# Patient Record
Sex: Female | Born: 1970 | Race: White | Hispanic: No | Smoking: Never smoker
Health system: Southern US, Community
[De-identification: ages and names within clinical notes are randomized; demographics above are authoritative.]

## PROBLEM LIST (undated history)

## (undated) DIAGNOSIS — J309 Allergic rhinitis, unspecified: Secondary | ICD-10-CM

## (undated) DIAGNOSIS — A0472 Enterocolitis due to Clostridium difficile, not specified as recurrent: Secondary | ICD-10-CM

## (undated) DIAGNOSIS — T7840XA Allergy, unspecified, initial encounter: Secondary | ICD-10-CM

## (undated) DIAGNOSIS — I10 Essential (primary) hypertension: Secondary | ICD-10-CM

## (undated) DIAGNOSIS — F419 Anxiety disorder, unspecified: Secondary | ICD-10-CM

## (undated) HISTORY — DX: Enterocolitis due to Clostridium difficile, not specified as recurrent: A04.72

## (undated) HISTORY — PX: TONSILLECTOMY: SUR1361

## (undated) HISTORY — DX: Allergic rhinitis, unspecified: J30.9

## (undated) HISTORY — DX: Essential (primary) hypertension: I10

## (undated) HISTORY — DX: Anxiety disorder, unspecified: F41.9

## (undated) HISTORY — DX: Allergy, unspecified, initial encounter: T78.40XA

---

## 2001-02-10 ENCOUNTER — Emergency Department (HOSPITAL_COMMUNITY): Admission: EM | Admit: 2001-02-10 | Discharge: 2001-02-10 | Payer: Self-pay | Admitting: Emergency Medicine

## 2001-02-10 ENCOUNTER — Encounter: Payer: Self-pay | Admitting: Emergency Medicine

## 2001-05-15 ENCOUNTER — Other Ambulatory Visit: Admission: RE | Admit: 2001-05-15 | Discharge: 2001-05-15 | Payer: Self-pay | Admitting: Obstetrics and Gynecology

## 2005-07-26 ENCOUNTER — Other Ambulatory Visit: Admission: RE | Admit: 2005-07-26 | Discharge: 2005-07-26 | Payer: Self-pay | Admitting: Obstetrics & Gynecology

## 2013-05-09 ENCOUNTER — Other Ambulatory Visit: Payer: Self-pay | Admitting: Obstetrics & Gynecology

## 2013-05-09 DIAGNOSIS — R928 Other abnormal and inconclusive findings on diagnostic imaging of breast: Secondary | ICD-10-CM

## 2013-05-14 ENCOUNTER — Ambulatory Visit
Admission: RE | Admit: 2013-05-14 | Discharge: 2013-05-14 | Disposition: A | Discharge status: Home / Self Care | Payer: 59 | Source: Ambulatory Visit | Attending: Obstetrics & Gynecology | Admitting: Obstetrics & Gynecology

## 2013-05-14 DIAGNOSIS — R928 Other abnormal and inconclusive findings on diagnostic imaging of breast: Secondary | ICD-10-CM

## 2014-06-05 ENCOUNTER — Other Ambulatory Visit: Payer: Self-pay

## 2014-06-05 DIAGNOSIS — Z1231 Encounter for screening mammogram for malignant neoplasm of breast: Secondary | ICD-10-CM

## 2014-06-10 ENCOUNTER — Ambulatory Visit
Admission: RE | Admit: 2014-06-10 | Discharge: 2014-06-10 | Disposition: A | Discharge status: Home / Self Care | Payer: 59 | Source: Ambulatory Visit

## 2014-06-10 DIAGNOSIS — Z1231 Encounter for screening mammogram for malignant neoplasm of breast: Secondary | ICD-10-CM

## 2014-06-12 ENCOUNTER — Other Ambulatory Visit: Payer: Self-pay | Admitting: Obstetrics & Gynecology

## 2014-06-12 DIAGNOSIS — R928 Other abnormal and inconclusive findings on diagnostic imaging of breast: Secondary | ICD-10-CM

## 2014-06-19 ENCOUNTER — Ambulatory Visit
Admission: RE | Admit: 2014-06-19 | Discharge: 2014-06-19 | Disposition: A | Discharge status: Home / Self Care | Payer: 59 | Source: Ambulatory Visit | Attending: Obstetrics & Gynecology | Admitting: Obstetrics & Gynecology

## 2014-06-19 DIAGNOSIS — R928 Other abnormal and inconclusive findings on diagnostic imaging of breast: Secondary | ICD-10-CM

## 2014-11-26 ENCOUNTER — Other Ambulatory Visit: Payer: Self-pay | Admitting: Obstetrics & Gynecology

## 2014-11-26 DIAGNOSIS — D241 Benign neoplasm of right breast: Secondary | ICD-10-CM

## 2014-12-18 ENCOUNTER — Ambulatory Visit
Admission: RE | Admit: 2014-12-18 | Discharge: 2014-12-18 | Disposition: A | Discharge status: Home / Self Care | Payer: 59 | Source: Ambulatory Visit | Attending: Obstetrics & Gynecology | Admitting: Obstetrics & Gynecology

## 2014-12-18 DIAGNOSIS — D241 Benign neoplasm of right breast: Secondary | ICD-10-CM

## 2015-05-18 ENCOUNTER — Other Ambulatory Visit: Payer: Self-pay | Admitting: Obstetrics & Gynecology

## 2015-05-18 DIAGNOSIS — N6310 Unspecified lump in the right breast, unspecified quadrant: Secondary | ICD-10-CM

## 2015-06-23 ENCOUNTER — Ambulatory Visit
Admission: RE | Admit: 2015-06-23 | Discharge: 2015-06-23 | Disposition: A | Discharge status: Home / Self Care | Payer: 59 | Source: Ambulatory Visit | Attending: Obstetrics & Gynecology | Admitting: Obstetrics & Gynecology

## 2015-06-23 DIAGNOSIS — N6310 Unspecified lump in the right breast, unspecified quadrant: Secondary | ICD-10-CM

## 2016-05-16 ENCOUNTER — Other Ambulatory Visit: Payer: Self-pay | Admitting: Obstetrics & Gynecology

## 2016-05-16 DIAGNOSIS — N6310 Unspecified lump in the right breast, unspecified quadrant: Secondary | ICD-10-CM

## 2016-06-23 ENCOUNTER — Ambulatory Visit
Admission: RE | Admit: 2016-06-23 | Discharge: 2016-06-23 | Disposition: A | Discharge status: Home / Self Care | Payer: 59 | Source: Ambulatory Visit | Attending: Obstetrics & Gynecology | Admitting: Obstetrics & Gynecology

## 2016-06-23 ENCOUNTER — Other Ambulatory Visit: Payer: Self-pay | Admitting: Obstetrics & Gynecology

## 2016-06-23 DIAGNOSIS — N6320 Unspecified lump in the left breast, unspecified quadrant: Secondary | ICD-10-CM

## 2016-06-23 DIAGNOSIS — N6310 Unspecified lump in the right breast, unspecified quadrant: Secondary | ICD-10-CM

## 2016-12-20 ENCOUNTER — Encounter: Payer: Self-pay | Admitting: Gastroenterology

## 2017-01-24 DIAGNOSIS — A0472 Enterocolitis due to Clostridium difficile, not specified as recurrent: Secondary | ICD-10-CM | POA: Insufficient documentation

## 2017-01-31 ENCOUNTER — Ambulatory Visit (INDEPENDENT_AMBULATORY_CARE_PROVIDER_SITE_OTHER): Payer: 59 | Admitting: Gastroenterology

## 2017-01-31 ENCOUNTER — Encounter: Payer: Self-pay | Admitting: Gastroenterology

## 2017-01-31 ENCOUNTER — Encounter (INDEPENDENT_AMBULATORY_CARE_PROVIDER_SITE_OTHER): Payer: Self-pay

## 2017-01-31 VITALS — BP 126/90 | HR 108 | Ht 62.25 in | Wt 187.0 lb

## 2017-01-31 DIAGNOSIS — Z800 Family history of malignant neoplasm of digestive organs: Secondary | ICD-10-CM

## 2017-01-31 DIAGNOSIS — A0472 Enterocolitis due to Clostridium difficile, not specified as recurrent: Secondary | ICD-10-CM

## 2017-01-31 MED ORDER — NA SULFATE-K SULFATE-MG SULF 17.5-3.13-1.6 GM/177ML PO SOLN: 1.0000 | Freq: Once | ORAL | 0 refills | Status: AC

## 2017-01-31 NOTE — Patient Instructions (Addendum)
Complete your vancomycin (another 7 days).  Call if your symptoms recur.  You will be set up for a colonoscopy for about 4-5 weeks from now for colon cancer screening, FH+.

## 2017-01-31 NOTE — Progress Notes (Signed)
HPI: This is a  very pleasant 46 year old woman  who was referred to me by PCP at [facility] urgent care to evaluate  family history colon cancer .    Chief complaint is Brother with colon cancer, also recently diagnosed Clostridium difficile diarrhea  She is on vancomycin orally for C. difficile.  Sinus infection 11/2016, augmentin.    Started having terrible diarrhea about 10 day.  Watery, non.blody, profuse diarrhea.  C. Diff  Was postive, flagyl tid for 10days. Initially it helped to about 90%, then symptoms recurred about a week ago.  Has been on vancomycin 125mg  QID.  Much better, more complete response.  Much closer to nomral. She has another 7 days of abx.  Never has trouble otherwise.  Moves her bowels regularly without any overt bleeding or significant abdominal pains  Brother diagnosed with CRC at age 43 (I diagnosed).    Review of systems: Pertinent positive and negative review of systems were noted in the above HPI section. Complete review of systems was performed and was otherwise normal.   Past Medical History:  Diagnosis Date  . Allergic rhinitis   . Anxiety   . HTN (hypertension)     Past Surgical History:  Procedure Laterality Date  . TONSILLECTOMY      Current Outpatient Prescriptions  Medication Sig Dispense Refill  . Calcium Citrate-Vitamin D (CALCIUM CITRATE + D PO) Take 1 tablet by mouth daily.    . cetirizine (ZYRTEC) 10 MG tablet Take 10 mg by mouth daily.    . Cholecalciferol (VITAMIN D3) 1000 units CAPS Take 1 capsule by mouth daily.    . hydrochlorothiazide (HYDRODIURIL) 12.5 MG tablet Take 1 tablet by mouth daily.    . Multiple Vitamins-Iron (MULTIVITAMIN/IRON PO) Take 1 tablet by mouth daily.    . Probiotic Product (PROBIOTIC PO) Take 1 tablet by mouth daily.    . sertraline (ZOLOFT) 50 MG tablet Take 0.5 tablets by mouth daily.    . vancomycin (VANCOCIN) 125 MG capsule Take 1 capsule by mouth every 6 (six) hours.    Marland Kitchen VIENVA 0.1-20 MG-MCG  tablet Take 1 tablet by mouth daily.    . vitamin B-12 (CYANOCOBALAMIN) 1000 MCG tablet Take 1,000 mcg by mouth daily.     No current facility-administered medications for this visit.     Allergies as of 01/31/2017  . (No Known Allergies)    Family History  Problem Relation Age of Onset  . Lung cancer Father   . Colon cancer Brother 2  . Lung cancer Maternal Grandmother   . Liver cancer Maternal Grandfather   . Heart disease Paternal Grandfather   . Prostate cancer Paternal Uncle   . Brain cancer Paternal Uncle     x 2  . Diabetes Other     maternal greatgraandmother  . Heart disease Paternal Aunt     Social History   Social History  . Marital status: Unknown    Spouse name: N/A  . Number of children: 1  . Years of education: N/A   Occupational History  . Public affairs consultant    Social History Main Topics  . Smoking status: Never Smoker  . Smokeless tobacco: Never Used  . Alcohol use Yes     Comment: rarely  . Drug use: No  . Sexual activity: Not on file   Other Topics Concern  . Not on file   Social History Narrative  . No narrative on file     Physical Exam: BP 126/90 (BP Location:  Left Arm, Patient Position: Sitting, Cuff Size: Normal)   Pulse (!) 108   Ht 5' 2.25" (1.581 m) Comment: height measured without shoes  Wt 187 lb (84.8 kg)   LMP 01/26/2017   BMI 33.93 kg/m  Constitutional: generally well-appearing Psychiatric: alert and oriented x3 Eyes: extraocular movements intact Mouth: oral pharynx moist, no lesions Neck: supple no lymphadenopathy Cardiovascular: heart regular rate and rhythm Lungs: clear to auscultation bilaterally Abdomen: soft, nontender, nondistended, no obvious ascites, no peritoneal signs, normal bowel sounds Extremities: no lower extremity edema bilaterally Skin: no lesions on visible extremities   Assessment and plan: 46 y.o. female with  Significant family history of colon cancer, also recurrent Clostridium  difficile  Hopefully the oral vancomycin will eradicate her Clostridium difficile. She has another 7 days and I encouraged her to continue, complete that antibiotic course. We will arrange for screening examination of her colon in about 5 weeks' time to allow her to recover more fully from Clostridium difficile. Her brother had colon cancer at age 15 and she understands she will need colon cancer screening at least every 5 years due to that significant family history.    Please see the "Patient Instructions" section for addition details about the plan.   Owens Loffler, MD [facility] 01/31/2017, 10:54 AM  Cc: No ref. provider found

## 2017-03-01 ENCOUNTER — Telehealth: Payer: Self-pay | Admitting: Gastroenterology

## 2017-03-01 ENCOUNTER — Other Ambulatory Visit: Payer: 59

## 2017-03-01 DIAGNOSIS — R197 Diarrhea, unspecified: Secondary | ICD-10-CM

## 2017-03-01 NOTE — Telephone Encounter (Signed)
Pt has been advised and will have stool studies this week.

## 2017-03-01 NOTE — Telephone Encounter (Signed)
Pt has 4-5 BM's daily they are not watery but are loose with some mucous. The loose stools began about 2 weeks ago and is now becoming more constant.  NO blood.  Pt finished vanco about 1 month ago. She has been taking a probiotic daily since last C diff infection.  She has colon scheduled on 03/27/17.  Do you want stool studies?

## 2017-03-01 NOTE — Telephone Encounter (Signed)
Yes, send stool for C. Diff by toxin and by PCR.  Ok to take one imodium every AM after waking as well.    Thanks

## 2017-03-02 ENCOUNTER — Other Ambulatory Visit: Payer: 59

## 2017-03-02 DIAGNOSIS — R197 Diarrhea, unspecified: Secondary | ICD-10-CM

## 2017-03-03 ENCOUNTER — Telehealth: Payer: Self-pay | Admitting: *Deleted

## 2017-03-03 ENCOUNTER — Other Ambulatory Visit: Payer: Self-pay | Admitting: *Deleted

## 2017-03-03 LAB — C. DIFFICILE GDH AND TOXIN A/B
C. DIFF TOXIN A/B: NOT DETECTED
C. DIFFICILE GDH: DETECTED — AB

## 2017-03-03 LAB — CLOSTRIDIUM DIFFICILE BY PCR: CDIFFPCR: DETECTED — AB

## 2017-03-03 MED ORDER — AMBULATORY NON FORMULARY MEDICATION: 0 refills | Status: DC

## 2017-03-03 NOTE — Telephone Encounter (Signed)
Called the patient to advise her of the positive C-Diff result. Also advised we are sending a new script for Vancomycin 125 mg for a longer tapering course, per Dr. Ardis Hughs.  I told her she can pick this up at her pharmacy, [facility], [place].  Also I have her scheduled her with Dr. Ardis Hughs for 04-07-2017 at 1:30 PM.

## 2017-03-03 NOTE — Telephone Encounter (Addendum)
Should cancel the upcoming colonoscoy and we'll reschedule at time of her rov, thanks

## 2017-03-03 NOTE — Telephone Encounter (Signed)
Representative from [facility] called with a call report for this patient.  Positive C-Diff. Dr. Ardis Hughs patient.

## 2017-03-03 NOTE — Telephone Encounter (Signed)
Dr. Ardis Hughs, I called the patient to advise her of the test results and to pick up her script at her pharmacy.  I made her an appointment with you for 04-07-2017.  She has a colonoscopy scheduled for 03-27-2017.  Do you want her to reschedule that? She will be taking the Vanco until 04-07-2017.

## 2017-03-03 NOTE — Telephone Encounter (Signed)
Called patient to advise per Dr. Ardis Hughs, I cancelled the colonoscopy scheduled for 03-27-2017. We will reschedule at the office visit on 04-07-2017.

## 2017-03-03 NOTE — Telephone Encounter (Signed)
Ok, lets get her back on vancomycin, this time for a long tapering course.  Vancomycin 125mg  po qid for one week, then tid for one week, then bid for one week, then qd for one week, then qod for one week. No refills.  Thanks, also rov with me in 5-6 weeks.

## 2017-03-17 ENCOUNTER — Encounter: Payer: 59 | Admitting: Gastroenterology

## 2017-03-20 ENCOUNTER — Telehealth: Payer: Self-pay | Admitting: Gastroenterology

## 2017-03-20 NOTE — Telephone Encounter (Signed)
Pt was advised to call her pharmacy, we prescribed 72 tablets not 56.  She will call and confirm the prescription at her pharmacy and call back if needed.

## 2017-03-27 ENCOUNTER — Encounter: Payer: 59 | Admitting: Gastroenterology

## 2017-04-07 ENCOUNTER — Encounter (INDEPENDENT_AMBULATORY_CARE_PROVIDER_SITE_OTHER): Payer: Self-pay

## 2017-04-07 ENCOUNTER — Encounter: Payer: Self-pay | Admitting: Gastroenterology

## 2017-04-07 ENCOUNTER — Ambulatory Visit (INDEPENDENT_AMBULATORY_CARE_PROVIDER_SITE_OTHER): Payer: 59 | Admitting: Gastroenterology

## 2017-04-07 VITALS — BP 132/90 | HR 96 | Ht 62.25 in | Wt 189.2 lb

## 2017-04-07 DIAGNOSIS — A0472 Enterocolitis due to Clostridium difficile, not specified as recurrent: Secondary | ICD-10-CM | POA: Diagnosis not present

## 2017-04-07 DIAGNOSIS — Z800 Family history of malignant neoplasm of digestive organs: Secondary | ICD-10-CM | POA: Diagnosis not present

## 2017-04-07 NOTE — Patient Instructions (Signed)
You will be scheduled for a Colonoscopy for family history of colon cancer.    We do not have a schedule out today, we will call you as soon as we open some appointment dates to set up the procedure.

## 2017-04-07 NOTE — Progress Notes (Signed)
Review of pertinent gastrointestinal problems: 1. FH of colon cancer:  Her brother was diagnosed with colon cancer in his 95s 2. C. difficile associated diarrhea: 2018, following course of Augmentin,  Started having terrible diarrhea about 10 day afterwards.  Watery, non.blody, profuse diarrhea.  C. Diff  Was postive, flagyl tid for 10days. Initially it helped to about 90%, then symptoms recurred about a week ago.  Has been on vancomycin 125mg  QID.  Much better, more complete response. She had clinical recurrence( soft mushy foul-smelling stools) about 2 weeks after stopping that two-week oral vancomycin course. Stool studies showed PCR positive, C. difficile antigen positive, toxin negative. I put her on a full long tapering course of vancomycin with great response.   HPI: This is a very pleasant 46 year old woman whom I last saw 2 or 3 months ago  Chief complaint is family history colon cancer, recent Clostridium difficile associated diarrhea   She was started on long tapering course of oral vancomycin and after about 2 weeks of that 6 week long course her stools have completely returned to normal. She has 2 pills of the vancomycin left. She feels absolutely fine.  ROS: complete GI ROS as described in HPI, all other review negative.  Constitutional:  No unintentional weight loss   Past Medical History:  Diagnosis Date  . Allergic rhinitis   . Anxiety   . C. difficile diarrhea   . HTN (hypertension)     Past Surgical History:  Procedure Laterality Date  . TONSILLECTOMY      Current Outpatient Prescriptions  Medication Sig Dispense Refill  . AMBULATORY NON FORMULARY MEDICATION Medication Name: Vancomycin 125 mg- 4 tab daily x 7 days, 3 tab daily x 7 days, 2 tabs daily x 7 days, 1 tab daily x 7 days, 1 tab every other day x 7 days. 72 tablet 0  . Calcium Citrate-Vitamin D (CALCIUM CITRATE + D PO) Take 1 tablet by mouth daily.    . cetirizine (ZYRTEC) 10 MG tablet Take 10 mg by  mouth daily.    . Cholecalciferol (VITAMIN D3) 1000 units CAPS Take 1 capsule by mouth daily.    . hydrochlorothiazide (HYDRODIURIL) 12.5 MG tablet Take 1 tablet by mouth daily.    . Multiple Vitamins-Iron (MULTIVITAMIN/IRON PO) Take 1 tablet by mouth daily.    . Probiotic Product (PROBIOTIC PO) Take 1 tablet by mouth daily.    . sertraline (ZOLOFT) 50 MG tablet Take 0.5 tablets by mouth daily.    . vancomycin (VANCOCIN) 125 MG capsule Take 1 capsule by mouth every 6 (six) hours.    Marland Kitchen VIENVA 0.1-20 MG-MCG tablet Take 1 tablet by mouth daily.    . vitamin B-12 (CYANOCOBALAMIN) 1000 MCG tablet Take 1,000 mcg by mouth daily.     No current facility-administered medications for this visit.     Allergies as of 04/07/2017  . (No Known Allergies)    Family History  Problem Relation Age of Onset  . Lung cancer Father   . Colon cancer Brother 29  . Lung cancer Maternal Grandmother   . Liver cancer Maternal Grandfather   . Heart disease Paternal Grandfather   . Prostate cancer Paternal Uncle   . Brain cancer Paternal Uncle        x 2  . Diabetes Other        maternal greatgraandmother  . Heart disease Paternal Aunt     Social History   Social History  . Marital status: Unknown    Spouse  name: N/A  . Number of children: 1  . Years of education: N/A   Occupational History  . Public affairs consultant    Social History Main Topics  . Smoking status: Never Smoker  . Smokeless tobacco: Never Used  . Alcohol use Yes     Comment: rarely  . Drug use: No  . Sexual activity: Not on file   Other Topics Concern  . Not on file   Social History Narrative  . No narrative on file     Physical Exam: BP 132/90 (BP Location: Left Arm, Patient Position: Sitting, Cuff Size: Normal)   Pulse 96   Ht 5' 2.25" (1.581 m)   Wt 189 lb 4 oz (85.8 kg)   LMP 03/30/2017   BMI 34.34 kg/m  Constitutional: generally well-appearing Psychiatric: alert and oriented x3 Abdomen: soft, nontender,  nondistended, no obvious ascites, no peritoneal signs, normal bowel sounds No peripheral edema noted in lower extremities  Assessment and plan: 46 y.o. female with C. difficile diarrhea, recurrent, clinically back to normal after long tapering course of vancomycin  She responded well to the long tapering course of oral vancomycin. She will continue, complete that course with 2 more days of vancomycin. We will arrange for colonoscopy for family history of colon cancer to be done in 4-6 weeks from now. That will give some time for her body to clear if she recurs again. She will continue on probiotics in the meantime. I see no reason for any further blood tests or imaging studies prior to then.  Please see the "Patient Instructions" section for addition details about the plan.  Owens Loffler, MD [facility] 04/07/2017, 1:39 PM

## 2017-04-14 ENCOUNTER — Telehealth: Payer: Self-pay

## 2017-04-14 ENCOUNTER — Encounter: Payer: Self-pay | Admitting: Gastroenterology

## 2017-04-14 DIAGNOSIS — Z800 Family history of malignant neoplasm of digestive organs: Secondary | ICD-10-CM

## 2017-04-14 NOTE — Telephone Encounter (Signed)
Left message on machine to call back  

## 2017-04-14 NOTE — Telephone Encounter (Signed)
-----   Message from Jeoffrey Massed, RN sent at 04/07/2017  2:08 PM EDT ----- Pt needs colon, schedule not out

## 2017-04-14 NOTE — Telephone Encounter (Signed)
The pt has been scheduled for colon and instructed she already ha the prep on hand and will call with any questions.

## 2017-04-25 ENCOUNTER — Ambulatory Visit (AMBULATORY_SURGERY_CENTER): Payer: 59 | Admitting: Gastroenterology

## 2017-04-25 ENCOUNTER — Encounter: Payer: Self-pay | Admitting: Gastroenterology

## 2017-04-25 VITALS — BP 127/87 | HR 86 | Temp 99.6°F | Resp 13 | Ht 62.25 in | Wt 189.0 lb

## 2017-04-25 DIAGNOSIS — Z800 Family history of malignant neoplasm of digestive organs: Secondary | ICD-10-CM

## 2017-04-25 DIAGNOSIS — Z1211 Encounter for screening for malignant neoplasm of colon: Secondary | ICD-10-CM

## 2017-04-25 DIAGNOSIS — Z1212 Encounter for screening for malignant neoplasm of rectum: Secondary | ICD-10-CM | POA: Diagnosis not present

## 2017-04-25 MED ORDER — SODIUM CHLORIDE 0.9 % IV SOLN: 500.0000 mL | INTRAVENOUS | Status: AC

## 2017-04-25 NOTE — Progress Notes (Signed)
Spontaneous respirations throughout. VSS. Resting comfortably. To PACU on room air. Report to  [employer].

## 2017-04-25 NOTE — Op Note (Signed)
[facility] Patient Name: Roshonda Sperl Procedure Date: 04/25/2017 1:20 PM MRN: [id number] Endoscopist: Milus Banister , MD Age: 46 Referring MD:  Date of Birth: 1970/12/02 Gender: Female Account #: [id number] Procedure:                Colonoscopy Indications:              Screening in patient at increased risk: Family                            history of 1st-degree relative with colorectal                            cancer before age 39 years (brother at age 50) Medicines:                Monitored Anesthesia Care Procedure:                Pre-Anesthesia Assessment:                           - Prior to the procedure, a History and Physical                            was performed, and patient medications and                            allergies were reviewed. The patient's tolerance of                            previous anesthesia was also reviewed. The risks                            and benefits of the procedure and the sedation                            options and risks were discussed with the patient.                            All questions were answered, and informed consent                            was obtained. Prior Anticoagulants: The patient has                            taken no previous anticoagulant or antiplatelet                            agents. ASA Grade Assessment: II - A patient with                            mild systemic disease. After reviewing the risks                            and benefits, the patient was deemed in  satisfactory condition to undergo the procedure.                           After obtaining informed consent, the colonoscope                            was passed under direct vision. Throughout the                            procedure, the patient's blood pressure, pulse, and                            oxygen saturations were monitored continuously. The                            Model CF-HQ190L  [id number]) scope was introduced                            through the anus and advanced to the the cecum,                            identified by appendiceal orifice and ileocecal                            valve. The colonoscopy was performed without                            difficulty. The patient tolerated the procedure                            well. The quality of the bowel preparation was                            excellent. The ileocecal valve, appendiceal                            orifice, and rectum were photographed. Scope In: 1:25:55 PM Scope Out: 1:35:26 PM Scope Withdrawal Time: 0 hours 7 minutes 11 seconds  Total Procedure Duration: 0 hours 9 minutes 31 seconds  Findings:                 The entire examined colon appeared normal on direct                            and retroflexion views. Complications:            No immediate complications. Estimated blood loss:                            None. Estimated Blood Loss:     Estimated blood loss: none. Impression:               - The entire examined colon is normal on direct and                            retroflexion  views.                           - No polyps or cancers. Recommendation:           - Patient has a contact number available for                            emergencies. The signs and symptoms of potential                            delayed complications were discussed with the                            patient. Return to normal activities tomorrow.                            Written discharge instructions were provided to the                            patient.                           - Resume previous diet.                           - Continue present medications.                           - Repeat colonoscopy in 5 years for screening                            purposes. Milus Banister, MD 04/25/2017 1:37:44 PM This report has been signed electronically.

## 2017-04-25 NOTE — Patient Instructions (Signed)
YOU HAD AN ENDOSCOPIC PROCEDURE TODAY AT THE [facility]:   Refer to the procedure report that was given to you for any specific questions about what was found during the examination.  If the procedure report does not answer your questions, please call your gastroenterologist to clarify.  If you requested that your care partner not be given the details of your procedure findings, then the procedure report has been included in a sealed envelope for you to review at your convenience later.  YOU SHOULD EXPECT: Some feelings of bloating in the abdomen. Passage of more gas than usual.  Walking can help get rid of the air that was put into your GI tract during the procedure and reduce the bloating. If you had a lower endoscopy (such as a colonoscopy or flexible sigmoidoscopy) you may notice spotting of blood in your stool or on the toilet paper. If you underwent a bowel prep for your procedure, you may not have a normal bowel movement for a few days.  Please Note:  You might notice some irritation and congestion in your nose or some drainage.  This is from the oxygen used during your procedure.  There is no need for concern and it should clear up in a day or so.  SYMPTOMS TO REPORT IMMEDIATELY:   Following lower endoscopy (colonoscopy or flexible sigmoidoscopy):  Excessive amounts of blood in the stool  Significant tenderness or worsening of abdominal pains  Swelling of the abdomen that is new, acute  Fever of 100F or higher   For urgent or emergent issues, a gastroenterologist can be reached at any hour by calling [phone].   DIET:  We do recommend a small meal at first, but then you may proceed to your regular diet.  Drink plenty of fluids but you should avoid alcoholic beverages for 24 hours.  ACTIVITY:  You should plan to take it easy for the rest of today and you should NOT DRIVE or use heavy machinery until tomorrow (because of the sedation medicines used during the test).     FOLLOW UP: Our staff will call the number listed on your records the next business day following your procedure to check on you and address any questions or concerns that you may have regarding the information given to you following your procedure. If we do not reach you, we will leave a message.  However, if you are feeling well and you are not experiencing any problems, there is no need to return our call.  We will assume that you have returned to your regular daily activities without incident.  If any biopsies were taken you will be contacted by phone or by letter within the next 1-3 weeks.  Please call us at [phone] if you have not heard about the biopsies in 3 weeks.    SIGNATURES/CONFIDENTIALITY: You and/or your care partner have signed paperwork which will be entered into your electronic medical record.  These signatures attest to the fact that that the information above on your After Visit Summary has been reviewed and is understood.  Full responsibility of the confidentiality of this discharge information lies with you and/or your care-partner.  You will need another colonoscopy in 5 years. 

## 2017-04-26 ENCOUNTER — Telehealth: Payer: Self-pay | Admitting: *Deleted

## 2017-04-26 NOTE — Telephone Encounter (Signed)
  Follow up Call-  Call back number 04/25/2017  Post procedure Call Back phone  # [phone]  Permission to leave phone message Yes  Some recent data might be hidden     Patient questions:  Do you have a fever, pain , or abdominal swelling? No. Pain Score  0 *  Have you tolerated food without any problems? Yes.    Have you been able to return to your normal activities? Yes.    Do you have any questions about your discharge instructions: Diet   No. Medications  No. Follow up visit  No.  Do you have questions or concerns about your Care? Yes.    Actions: * If pain score is 4 or above: No action needed, pain <4.

## 2017-05-19 ENCOUNTER — Other Ambulatory Visit: Payer: Self-pay | Admitting: Obstetrics & Gynecology

## 2017-05-19 DIAGNOSIS — Z1231 Encounter for screening mammogram for malignant neoplasm of breast: Secondary | ICD-10-CM

## 2017-05-25 ENCOUNTER — Telehealth: Payer: Self-pay | Admitting: Gastroenterology

## 2017-05-25 ENCOUNTER — Other Ambulatory Visit: Payer: 59

## 2017-05-25 DIAGNOSIS — Z8619 Personal history of other infectious and parasitic diseases: Secondary | ICD-10-CM

## 2017-05-25 DIAGNOSIS — R197 Diarrhea, unspecified: Secondary | ICD-10-CM

## 2017-05-25 NOTE — Telephone Encounter (Signed)
Yes, c diff by PCR and also c diff by toxin.   Thanks

## 2017-05-25 NOTE — Telephone Encounter (Signed)
The pt has been notified that the stool tests have been ordered and she can come in at her convenience

## 2017-05-25 NOTE — Telephone Encounter (Signed)
Pt has returning nausea and bowel changes for the past 2 weeks.  Some odor and getting softer.  Also, she is having more frequent stools 3-4 per day up from 2 per day.  Has a recent C diff history 02/2017 finished a vanc course.  Please advise if she needs stool testing.

## 2017-05-29 ENCOUNTER — Other Ambulatory Visit: Payer: 59

## 2017-05-29 DIAGNOSIS — R1310 Dysphagia, unspecified: Secondary | ICD-10-CM

## 2017-05-29 DIAGNOSIS — Z8619 Personal history of other infectious and parasitic diseases: Secondary | ICD-10-CM

## 2017-05-29 DIAGNOSIS — R197 Diarrhea, unspecified: Secondary | ICD-10-CM

## 2017-05-30 ENCOUNTER — Other Ambulatory Visit: Payer: Self-pay

## 2017-05-30 ENCOUNTER — Telehealth: Payer: Self-pay | Admitting: Internal Medicine

## 2017-05-30 LAB — C. DIFFICILE GDH AND TOXIN A/B
C. difficile GDH: DETECTED — AB
C. difficile Toxin A/B: DETECTED — AB

## 2017-05-30 LAB — CLOSTRIDIUM DIFFICILE BY PCR: CDIFFPCR: DETECTED — AB

## 2017-05-30 MED ORDER — RIFAXIMIN 200 MG PO TABS: 400.0000 mg | ORAL_TABLET | Freq: Three times a day (TID) | ORAL | 0 refills | Status: AC

## 2017-05-30 MED ORDER — VANCOMYCIN HCL 125 MG PO CAPS: 125.0000 mg | ORAL_CAPSULE | Freq: Four times a day (QID) | ORAL | 0 refills | Status: AC

## 2017-05-30 NOTE — Telephone Encounter (Signed)
I was going to call and talk with you this am.  Thanks for the response back.  If you need any other information or need anything from me, just let me know.

## 2017-05-30 NOTE — Telephone Encounter (Signed)
Dr. Nicki Reaper, thanks for the information.     Jeanette Stuart, Please call her in vancomycin 125mg  orally, qid for 10 days.  She will follow that with xifaxin 400mg  PO tid for 20 days (to be started immediately after she completes the course of vancomycin).  Will need xifaxin called in as well. No refills for either.  If she recurs again, she will likely need referral to ID Dr. Graylon Good to FMT (stool transplant)  thanks

## 2017-05-30 NOTE — Telephone Encounter (Signed)
The pt has been advised and both prescriptions sent to the pharmacy.  She will call back if no better or symptoms return

## 2017-05-30 NOTE — Telephone Encounter (Signed)
Received call this am.  pts c.diff is positive.  I notified pt.  She reported she has recent completed "weeks of vancomycin".  I discussed with her regarding further treatment.  I explained that I would notify you of the results and see how you wanted to proceed with treatment.  I will try to contact your office this am.

## 2017-06-01 ENCOUNTER — Telehealth: Payer: Self-pay | Admitting: Gastroenterology

## 2017-06-05 ENCOUNTER — Telehealth: Payer: Self-pay | Admitting: Gastroenterology

## 2017-06-05 MED ORDER — RIFAXIMIN 550 MG PO TABS: 550.0000 mg | ORAL_TABLET | Freq: Two times a day (BID) | ORAL | 0 refills | Status: AC

## 2017-06-05 NOTE — Telephone Encounter (Signed)
Jeanette Stuart was asked to try and get samples for the pt.

## 2017-06-05 NOTE — Telephone Encounter (Signed)
Can you ask the xifaxan rep if they can help with this?

## 2017-06-05 NOTE — Telephone Encounter (Signed)
Ok, lets do 550mg  po bid for 3 weeks if they can accommodate.  Thanks

## 2017-06-05 NOTE — Telephone Encounter (Signed)
The pt prescription has ben sent to [facility] for prior auth Xifaxan 550 mg BID x 3 weeks.

## 2017-06-05 NOTE — Telephone Encounter (Signed)
Dr Ardis Hughs I spoke with the drug rep and they do not sample 200 mg.. Only 550 mg. Please advise

## 2017-06-05 NOTE — Telephone Encounter (Signed)
Dr Ardis Hughs the pt insurance has denied xifaxan. She has a few more days of Vanc.  Are there any changes you want to make?

## 2017-06-06 NOTE — Telephone Encounter (Signed)
[facility] states they need chart notes sent for patient to fax [phone]

## 2017-06-08 ENCOUNTER — Telehealth: Payer: Self-pay | Admitting: Gastroenterology

## 2017-06-08 ENCOUNTER — Other Ambulatory Visit: Payer: Self-pay

## 2017-06-08 NOTE — Telephone Encounter (Signed)
I was actually hoping for three week's worth of those samples.  Any chance??

## 2017-06-08 NOTE — Telephone Encounter (Signed)
Spoke to patient, she will come by our office to pick up Xifaxan 550 mg samples, to take one tablet BID. Lot# [id number], exp 06/22, lot# [id number], exp 06/20, Qty #45.

## 2017-06-08 NOTE — Telephone Encounter (Signed)
Called [facility], they did receive our office notes, prior Josem Kaufmann is still in review. We do have a week's worth of samples of Xifaxan 550 mg, BID that patient could pick up. Is this what you want her to continue taking? Thanks.

## 2017-06-08 NOTE — Telephone Encounter (Signed)
I will see what I can do and call the patient to come pick up.

## 2017-06-12 ENCOUNTER — Telehealth: Payer: Self-pay | Admitting: Gastroenterology

## 2017-06-13 NOTE — Telephone Encounter (Signed)
Routed to Dr. Ardis Hughs nurse.

## 2017-06-13 NOTE — Telephone Encounter (Signed)
Patient states that her insurance has denied xifixan. Best # [phone]

## 2017-07-03 ENCOUNTER — Ambulatory Visit
Admission: RE | Admit: 2017-07-03 | Discharge: 2017-07-03 | Disposition: A | Discharge status: Home / Self Care | Payer: 59 | Source: Ambulatory Visit | Attending: Obstetrics & Gynecology | Admitting: Obstetrics & Gynecology

## 2017-07-03 DIAGNOSIS — Z1231 Encounter for screening mammogram for malignant neoplasm of breast: Secondary | ICD-10-CM

## 2017-07-04 ENCOUNTER — Other Ambulatory Visit: Payer: Self-pay | Admitting: Obstetrics & Gynecology

## 2017-07-04 DIAGNOSIS — R928 Other abnormal and inconclusive findings on diagnostic imaging of breast: Secondary | ICD-10-CM

## 2017-07-05 ENCOUNTER — Telehealth: Payer: Self-pay | Admitting: Gastroenterology

## 2017-07-05 NOTE — Telephone Encounter (Signed)
The pt has been advised that only liquid stool can be tested.  She has no symptoms of C diff or diarrhea.  She will call if her symptoms return

## 2017-07-13 ENCOUNTER — Ambulatory Visit
Admission: RE | Admit: 2017-07-13 | Discharge: 2017-07-13 | Disposition: A | Discharge status: Home / Self Care | Payer: 59 | Source: Ambulatory Visit | Attending: Obstetrics & Gynecology | Admitting: Obstetrics & Gynecology

## 2017-07-13 DIAGNOSIS — R928 Other abnormal and inconclusive findings on diagnostic imaging of breast: Secondary | ICD-10-CM

## 2017-07-20 ENCOUNTER — Other Ambulatory Visit: Payer: 59

## 2017-07-20 ENCOUNTER — Telehealth: Payer: Self-pay | Admitting: Gastroenterology

## 2017-07-20 DIAGNOSIS — A0471 Enterocolitis due to Clostridium difficile, recurrent: Secondary | ICD-10-CM

## 2017-07-20 NOTE — Telephone Encounter (Signed)
Pt diarrhea started again on Sunday 3 times daily.  Mucous but no blood.  No fever, occasional abd pain.  Finished 2 courses vanc and she took 550 mg twice daily.   Xifaxan for 20 days (September)  after finishing her last vanc course.  Please advise

## 2017-07-20 NOTE — Telephone Encounter (Signed)
Ok, she needs repeat stool testing: c diff by PCR, C diff by toxin  Needs referral to ID clinic Dr. Graylon Good to consider fecal transplant for recurrent C. Diff.  Will hold off on any new antibiotics until the stool test results are back.  Thanks

## 2017-07-20 NOTE — Telephone Encounter (Signed)
The pt has been advised and will come in for stool testing and referral has been made to ID for recurrent C diff

## 2017-07-24 ENCOUNTER — Other Ambulatory Visit: Payer: 59

## 2017-07-24 DIAGNOSIS — A0471 Enterocolitis due to Clostridium difficile, recurrent: Secondary | ICD-10-CM

## 2017-07-26 LAB — CLOSTRIDIUM DIFFICILE BY PCR: CDIFFPCR: DETECTED — AB

## 2017-07-27 ENCOUNTER — Other Ambulatory Visit: Payer: Self-pay

## 2017-07-27 DIAGNOSIS — A09 Infectious gastroenteritis and colitis, unspecified: Secondary | ICD-10-CM

## 2017-07-28 ENCOUNTER — Other Ambulatory Visit: Payer: 59

## 2017-08-01 ENCOUNTER — Other Ambulatory Visit: Payer: 59

## 2017-08-01 DIAGNOSIS — R1310 Dysphagia, unspecified: Secondary | ICD-10-CM

## 2017-08-02 LAB — C. DIFFICILE GDH AND TOXIN A/B
GDH ANTIGEN: DETECTED
MICRO NUMBER:: 81153155
SPECIMEN QUALITY:: ADEQUATE
TOXIN A AND B: DETECTED

## 2017-08-21 ENCOUNTER — Encounter: Payer: Self-pay | Admitting: Internal Medicine

## 2017-08-21 ENCOUNTER — Ambulatory Visit: Payer: 59 | Admitting: Internal Medicine

## 2017-08-21 VITALS — BP 134/84 | HR 79 | Temp 98.1°F | Ht 62.0 in | Wt 182.0 lb

## 2017-08-21 DIAGNOSIS — I10 Essential (primary) hypertension: Secondary | ICD-10-CM | POA: Insufficient documentation

## 2017-08-21 DIAGNOSIS — D493 Neoplasm of unspecified behavior of breast: Secondary | ICD-10-CM | POA: Insufficient documentation

## 2017-08-21 DIAGNOSIS — A0471 Enterocolitis due to Clostridium difficile, recurrent: Secondary | ICD-10-CM

## 2017-08-21 DIAGNOSIS — R300 Dysuria: Secondary | ICD-10-CM | POA: Insufficient documentation

## 2017-08-21 NOTE — Progress Notes (Signed)
[facility]: new referral for cdiff and FMT candidate?  Patient ID: Jeanette Stuart, female   DOB: 1971-03-17, 46 y.o.   MRN: [id number]  HPI  46 year old woman  who was first diagnosed for cdifficile in April 2018on vancomycin orally for C. Difficile. She has had multiple recurrence on + on 5/18, 8/18 and now + on 10/18. This initially started with having a sinus infection  In 11/2016, and given a course of amox/clav 875mg  BID where she had terrible diarrhea x 10 day.  She then had ongoing Watery, non.blody, profuse diarrhea where she was tested for  C. Diff which was postive. She was first give a course of flagyl tid for 10days. Initially it helped to about 90%, then symptoms recurred about a week ago.  Has been on vancomycin 125mg  QID.  Has had 2 wk of oral vanco - April 2 wk of oral vanco Then did a taper of oral vanco 2 wk of oral vanco with rifaxamin taper in mid September  Then beginning of October started to have loose stools and retested + cdiff.  Stopped on its own roughly 4 wk  - no diarrhea since oct 6th  Now having 2 BM, soft, - per day. Not requiring any hospitalization for cdifficile   Usually takes abtx 1x /yr usually sinus infections. No other abtx exposure  ROS - fatigue -mild. Mild-to little weight loss. Otherwise 12 point ros is negative  Family hx: Brother diagnosed with CRC at age 97     Outpatient Encounter Medications as of 08/21/2017  Medication Sig  . ALPRAZolam (XANAX) 1 MG tablet alprazolam 1 mg tablet  . Calcium Citrate-Vitamin D (CALCIUM CITRATE + D PO) Take 1 tablet by mouth daily.  . cetirizine (ZYRTEC) 10 MG tablet Take 10 mg by mouth daily.  . Cholecalciferol (VITAMIN D3) 1000 units CAPS Take 1 capsule by mouth daily.  . hydrochlorothiazide (HYDRODIURIL) 12.5 MG tablet Take 1 tablet by mouth daily.  . Multiple Vitamins-Iron (MULTIVITAMIN/IRON PO) Take 1 tablet by mouth daily.  . Probiotic Product (PROBIOTIC PO) Take 1 tablet by mouth daily.  Marland Kitchen VIENVA  0.1-20 MG-MCG tablet Take 1 tablet by mouth daily.  . vitamin B-12 (CYANOCOBALAMIN) 1000 MCG tablet Take 1,000 mcg by mouth daily.  . AMBULATORY NON FORMULARY MEDICATION Medication Name: Vancomycin 125 mg- 4 tab daily x 7 days, 3 tab daily x 7 days, 2 tabs daily x 7 days, 1 tab daily x 7 days, 1 tab every other day x 7 days. (Patient not taking: Reported on 04/25/2017)  . sertraline (ZOLOFT) 50 MG tablet Take 0.5 tablets by mouth daily.   Facility-Administered Encounter Medications as of 08/21/2017  Medication  . 0.9 %  sodium chloride infusion     Patient Active Problem List   Diagnosis Date Noted  . Benign hypertension 08/21/2017  . Neoplasm of breast 08/21/2017  . Painful urging to urinate 08/21/2017  . Clostridium difficile colitis 01/24/2017     Health Maintenance Due  Topic Date Due  . HIV Screening  06/04/1987  . TETANUS/TDAP  06/04/1991  . PAP SMEAR  06/03/1993  . INFLUENZA VACCINE  05/17/2017    Social History   Tobacco Use  . Smoking status: Never Smoker  . Smokeless tobacco: Never Used  Substance Use Topics  . Alcohol use: Yes    Comment: rarely  . Drug use: No  family history includes Brain cancer in her paternal uncle; Colon cancer (age of onset: 22) in her brother; Diabetes in her other;  Heart disease in her paternal aunt and paternal grandfather; Liver cancer in her maternal grandfather; Lung cancer in her father and maternal grandmother; Prostate cancer in her paternal uncle. Review of Systems Review of Systems  Constitutional: Negative for fever, chills, diaphoresis, activity change, appetite change, fatigue and unexpected weight change.  HENT: Negative for congestion, sore throat, rhinorrhea, sneezing, trouble swallowing and sinus pressure.  Eyes: Negative for photophobia and visual disturbance.  Respiratory: Negative for cough, chest tightness, shortness of breath, wheezing and stridor.  Cardiovascular: Negative for chest pain, palpitations and leg  swelling.  Gastrointestinal: Negative for nausea, vomiting, abdominal pain, diarrhea, constipation, blood in stool, abdominal distention and anal bleeding.  Genitourinary: Negative for dysuria, hematuria, flank pain and difficulty urinating.  Musculoskeletal: Negative for myalgias, back pain, joint swelling, arthralgias and gait problem.  Skin: Negative for color change, pallor, rash and wound.  Neurological: Negative for dizziness, tremors, weakness and light-headedness.  Hematological: Negative for adenopathy. Does not bruise/bleed easily.  Psychiatric/Behavioral: Negative for behavioral problems, confusion, sleep disturbance, dysphoric mood, decreased concentration and agitation.    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 Will check insurance if can get coverage for colonoscopy this year vs. 2019 calendar year. She has had 4 episodes which would meet the criteria for getting FMT. I would not be surprised if she has had a relapse, though this is the longest period being free of diarrhea. Will discuss with dr Ardis Hughs, timing of Delta.  For now continue on probiotics.   With this many relapse, treatment failure is high since oral vanco becomes less effective now in the 50-60% range with so many relapses. FMT would be 80-85% curative. If she has diarrhea would start back on oral vanco 125mg  QID and move with having FMT.  Spent 45 min with patient with greater than 50% discussing management of cdifficile and benefits of FMT

## 2017-08-21 NOTE — Patient Instructions (Addendum)
  For more information regarding FMT - recommend to check out the [email or web address]   If you have 3+ loose stools in 24hr, please drop off specimen (in the kit) to our clinic for testing and treatment. Please call the clinic to let us know you are having symptoms again.  For now, we will see you as needed

## 2017-12-11 ENCOUNTER — Telehealth: Payer: Self-pay | Admitting: *Deleted

## 2017-12-11 NOTE — Telephone Encounter (Signed)
Patient called, has an abscessed tooth. She has an appointment Thursday for evaluation at an endodontist. Her dentist wants patient to be on an antibiotic, needs Dr Baxter Flattery to advise/prescribe the antibiotic due to the patient's history of c diff. Patient is asymptomatic from her c diff last year, does not have any dental pain at this time. She would prefer [facility] [address]. Dentist - Dr Eligha Bridegroom [phone]. Landis Gandy, RN

## 2017-12-12 ENCOUNTER — Other Ambulatory Visit: Payer: Self-pay | Admitting: *Deleted

## 2017-12-12 DIAGNOSIS — K047 Periapical abscess without sinus: Secondary | ICD-10-CM

## 2017-12-12 MED ORDER — PENICILLIN V POTASSIUM 500 MG PO TABS: 500.0000 mg | ORAL_TABLET | Freq: Four times a day (QID) | ORAL | 0 refills | Status: DC

## 2017-12-12 NOTE — Telephone Encounter (Signed)
Can do penicillin vk 500mg  QID x 7 d

## 2017-12-12 NOTE — Telephone Encounter (Signed)
Order placed, please cosign. Patient notified. Thanks!

## 2018-10-01 ENCOUNTER — Other Ambulatory Visit: Payer: Self-pay | Admitting: Obstetrics and Gynecology

## 2018-10-01 DIAGNOSIS — Z1231 Encounter for screening mammogram for malignant neoplasm of breast: Secondary | ICD-10-CM

## 2018-10-03 ENCOUNTER — Ambulatory Visit
Admission: RE | Admit: 2018-10-03 | Discharge: 2018-10-03 | Disposition: A | Discharge status: Home / Self Care | Payer: 59 | Source: Ambulatory Visit | Attending: Obstetrics and Gynecology | Admitting: Obstetrics and Gynecology

## 2018-10-03 DIAGNOSIS — Z1231 Encounter for screening mammogram for malignant neoplasm of breast: Secondary | ICD-10-CM

## 2019-09-20 ENCOUNTER — Other Ambulatory Visit: Payer: Self-pay | Admitting: Obstetrics and Gynecology

## 2019-09-20 DIAGNOSIS — Z1231 Encounter for screening mammogram for malignant neoplasm of breast: Secondary | ICD-10-CM

## 2019-11-11 ENCOUNTER — Other Ambulatory Visit: Payer: Self-pay

## 2019-11-11 ENCOUNTER — Ambulatory Visit
Admission: RE | Admit: 2019-11-11 | Discharge: 2019-11-11 | Disposition: A | Discharge status: Home / Self Care | Payer: 59 | Source: Ambulatory Visit | Attending: Obstetrics and Gynecology | Admitting: Obstetrics and Gynecology

## 2019-11-11 DIAGNOSIS — Z1231 Encounter for screening mammogram for malignant neoplasm of breast: Secondary | ICD-10-CM

## 2020-11-05 ENCOUNTER — Other Ambulatory Visit: Payer: Self-pay | Admitting: Obstetrics and Gynecology

## 2020-11-05 DIAGNOSIS — Z1231 Encounter for screening mammogram for malignant neoplasm of breast: Secondary | ICD-10-CM

## 2020-12-17 ENCOUNTER — Other Ambulatory Visit: Payer: Self-pay

## 2020-12-17 ENCOUNTER — Ambulatory Visit
Admission: RE | Admit: 2020-12-17 | Discharge: 2020-12-17 | Disposition: A | Discharge status: Home / Self Care | Payer: 59 | Source: Ambulatory Visit | Attending: Obstetrics and Gynecology | Admitting: Obstetrics and Gynecology

## 2020-12-17 DIAGNOSIS — Z1231 Encounter for screening mammogram for malignant neoplasm of breast: Secondary | ICD-10-CM

## 2021-11-17 ENCOUNTER — Other Ambulatory Visit: Payer: Self-pay | Admitting: Obstetrics and Gynecology

## 2021-11-17 DIAGNOSIS — Z1231 Encounter for screening mammogram for malignant neoplasm of breast: Secondary | ICD-10-CM

## 2021-12-20 ENCOUNTER — Ambulatory Visit
Admission: RE | Admit: 2021-12-20 | Discharge: 2021-12-20 | Disposition: A | Discharge status: Home / Self Care | Payer: BC Managed Care – PPO | Source: Ambulatory Visit | Attending: Obstetrics and Gynecology | Admitting: Obstetrics and Gynecology

## 2021-12-20 DIAGNOSIS — Z1231 Encounter for screening mammogram for malignant neoplasm of breast: Secondary | ICD-10-CM

## 2021-12-21 ENCOUNTER — Other Ambulatory Visit: Payer: Self-pay | Admitting: Obstetrics and Gynecology

## 2021-12-21 DIAGNOSIS — R928 Other abnormal and inconclusive findings on diagnostic imaging of breast: Secondary | ICD-10-CM

## 2022-01-06 ENCOUNTER — Ambulatory Visit
Admission: RE | Admit: 2022-01-06 | Discharge: 2022-01-06 | Disposition: A | Discharge status: Home / Self Care | Payer: BC Managed Care – PPO | Source: Ambulatory Visit | Attending: Obstetrics and Gynecology | Admitting: Obstetrics and Gynecology

## 2022-01-06 DIAGNOSIS — R928 Other abnormal and inconclusive findings on diagnostic imaging of breast: Secondary | ICD-10-CM

## 2022-03-08 ENCOUNTER — Encounter: Payer: Self-pay | Admitting: Gastroenterology

## 2022-03-31 ENCOUNTER — Encounter: Payer: Self-pay | Admitting: Gastroenterology

## 2022-05-09 ENCOUNTER — Ambulatory Visit (AMBULATORY_SURGERY_CENTER): Payer: Self-pay

## 2022-05-09 ENCOUNTER — Other Ambulatory Visit: Payer: Self-pay

## 2022-05-09 VITALS — Ht 62.0 in | Wt 189.9 lb

## 2022-05-09 DIAGNOSIS — Z800 Family history of malignant neoplasm of digestive organs: Secondary | ICD-10-CM

## 2022-05-09 MED ORDER — NA SULFATE-K SULFATE-MG SULF 17.5-3.13-1.6 GM/177ML PO SOLN: 1.0000 | Freq: Once | ORAL | 0 refills | Status: AC

## 2022-05-09 NOTE — Progress Notes (Signed)
Denies allergies to eggs or soy products. Denies complication of anesthesia or sedation. Denies use of weight loss medication. Denies use of O2.   Emmi instructions given for colonoscopy.  

## 2022-05-24 ENCOUNTER — Telehealth: Payer: Self-pay | Admitting: *Deleted

## 2022-05-24 NOTE — Telephone Encounter (Signed)
Spoke with pt and rescheduled her colonoscopy to 06-06-22 at 10:00 am with Dr. Lyndel Safe.  New instructions mailed to pt.

## 2022-05-30 ENCOUNTER — Encounter: Payer: Self-pay | Admitting: Gastroenterology

## 2022-06-06 ENCOUNTER — Ambulatory Visit (AMBULATORY_SURGERY_CENTER): Payer: BC Managed Care – PPO | Admitting: Gastroenterology

## 2022-06-06 ENCOUNTER — Encounter: Payer: BC Managed Care – PPO | Admitting: Gastroenterology

## 2022-06-06 ENCOUNTER — Encounter: Payer: Self-pay | Admitting: Gastroenterology

## 2022-06-06 VITALS — BP 114/77 | HR 78 | Temp 98.9°F | Resp 11 | Ht 62.0 in | Wt 189.9 lb

## 2022-06-06 DIAGNOSIS — Z1211 Encounter for screening for malignant neoplasm of colon: Secondary | ICD-10-CM | POA: Diagnosis not present

## 2022-06-06 DIAGNOSIS — Z800 Family history of malignant neoplasm of digestive organs: Secondary | ICD-10-CM | POA: Diagnosis not present

## 2022-06-06 MED ORDER — SODIUM CHLORIDE 0.9 % IV SOLN: 500.0000 mL | Freq: Once | INTRAVENOUS | Status: DC

## 2022-06-06 NOTE — Patient Instructions (Signed)
Handout on hemorrhoids given.  YOU HAD AN ENDOSCOPIC PROCEDURE TODAY AT [facility]:   Refer to the procedure report that was given to you for any specific questions about what was found during the examination.  If the procedure report does not answer your questions, please call your gastroenterologist to clarify.  If you requested that your care partner not be given the details of your procedure findings, then the procedure report has been included in a sealed envelope for you to review at your convenience later.  YOU SHOULD EXPECT: Some feelings of bloating in the abdomen. Passage of more gas than usual.  Walking can help get rid of the air that was put into your GI tract during the procedure and reduce the bloating. If you had a lower endoscopy (such as a colonoscopy or flexible sigmoidoscopy) you may notice spotting of blood in your stool or on the toilet paper. If you underwent a bowel prep for your procedure, you may not have a normal bowel movement for a few days.  Please Note:  You might notice some irritation and congestion in your nose or some drainage.  This is from the oxygen used during your procedure.  There is no need for concern and it should clear up in a day or so.  SYMPTOMS TO REPORT IMMEDIATELY:  Following lower endoscopy (colonoscopy or flexible sigmoidoscopy):  Excessive amounts of blood in the stool  Significant tenderness or worsening of abdominal pains  Swelling of the abdomen that is new, acute  Fever of 100F or higher  For urgent or emergent issues, a gastroenterologist can be reached at any hour by calling [phone]. Do not use MyChart messaging for urgent concerns.    DIET:  We do recommend a small meal at first, but then you may proceed to your regular diet.  Drink plenty of fluids but you should avoid alcoholic beverages for 24 hours.  ACTIVITY:  You should plan to take it easy for the rest of today and you should NOT DRIVE or use heavy  machinery until tomorrow (because of the sedation medicines used during the test).    FOLLOW UP: Our staff will call the number listed on your records the next business day following your procedure.  We will call around 7:15- 8:00 am to check on you and address any questions or concerns that you may have regarding the information given to you following your procedure. If we do not reach you, we will leave a message.  If you develop any symptoms (ie: fever, flu-like symptoms, shortness of breath, cough etc.) before then, please call [phone].  If you test positive for Covid 19 in the 2 weeks post procedure, please call and report this information to [place].    If any biopsies were taken you will be contacted by phone or by letter within the next 1-3 weeks.  Please call us at [phone] if you have not heard about the biopsies in 3 weeks.    SIGNATURES/CONFIDENTIALITY: You and/or your care partner have signed paperwork which will be entered into your electronic medical record.  These signatures attest to the fact that that the information above on your After Visit Summary has been reviewed and is understood.  Full responsibility of the confidentiality of this discharge information lies with you and/or your care-partner.

## 2022-06-06 NOTE — Progress Notes (Signed)
To pacu, VSS. Report to [email or web address] 

## 2022-06-06 NOTE — Progress Notes (Signed)
Pt's states no medical or surgical changes since previsit or office visit. 

## 2022-06-06 NOTE — Op Note (Signed)
[place] Patient Name: Jeanette Stuart Procedure Date: 06/06/2022 10:15 AM MRN: [id number] Endoscopist: Jackquline Denmark , MD Age: 51 Referring MD:  Date of Birth: [birth year] Gender: Female Account #: [id number] Procedure:                Colonoscopy Indications:              Screening in patient at increased risk: Colorectal                            cancer in brother at age 35 Medicines:                Monitored Anesthesia Care Procedure:                Pre-Anesthesia Assessment:                           - Prior to the procedure, a History and Physical                            was performed, and patient medications and                            allergies were reviewed. The patient's tolerance of                            previous anesthesia was also reviewed. The risks                            and benefits of the procedure and the sedation                            options and risks were discussed with the patient.                            All questions were answered, and informed consent                            was obtained. Prior Anticoagulants: The patient has                            taken no previous anticoagulant or antiplatelet                            agents. ASA Grade Assessment: I - A normal, healthy                            patient. After reviewing the risks and benefits,                            the patient was deemed in satisfactory condition to                            undergo the procedure.  After obtaining informed consent, the colonoscope                            was passed under direct vision. Throughout the                            procedure, the patient's blood pressure, pulse, and                            oxygen saturations were monitored continuously. The                            CF [id number] #[id number] was introduced through the anus                            and advanced to the the cecum, identified by                             appendiceal orifice and ileocecal valve. The                            colonoscopy was performed without difficulty. The                            patient tolerated the procedure well. The quality                            of the bowel preparation was good. The ileocecal                            valve, appendiceal orifice, and rectum were                            photographed. Scope In: 10:20:22 AM Scope Out: 10:31:02 AM Scope Withdrawal Time: 0 hours 6 minutes 31 seconds  Total Procedure Duration: 0 hours 10 minutes 40 seconds  Findings:                 The colon (entire examined portion) appeared normal.                           Non-bleeding internal hemorrhoids were found during                            retroflexion. The hemorrhoids were small and Grade                            I (internal hemorrhoids that do not prolapse).                           The exam was otherwise without abnormality on                            direct and retroflexion views. Complications:  No immediate complications. Estimated Blood Loss:     Estimated blood loss: none. Impression:               - The entire examined colon is normal.                           - Non-bleeding internal hemorrhoids.                           - The examination was otherwise normal on direct                            and retroflexion views.                           - No specimens collected. Recommendation:           - Patient has a contact number available for                            emergencies. The signs and symptoms of potential                            delayed complications were discussed with the                            patient. Return to normal activities tomorrow.                            Written discharge instructions were provided to the                            patient.                           - Resume previous diet.                           - Continue  present medications.                           - Repeat colonoscopy in 5 years for screening                            purposes. Earlier, if with any new problems or                            change in family history.                           - The findings and recommendations were discussed                            with the patient's family. Jackquline Denmark, MD 06/06/2022 10:34:26 AM This report has been signed electronically.

## 2022-06-06 NOTE — Progress Notes (Signed)
[facility] History and Physical   Primary Care Physician:  Everardo Beals, NP   Reason for Procedure:   Family history of colon cancer- brother at age 50  Plan:     colonoscopy     HPI: Jeanette Stuart is a 51 y.o. female  No nausea, vomiting, heartburn, regurgitation, odynophagia or dysphagia.  No significant diarrhea or constipation.  No melena or hematochezia. No unintentional weight loss. No abdominal pain.   Past Medical History:  Diagnosis Date   Allergic rhinitis    Allergy    SEASONAL   Anxiety    C. difficile diarrhea    HTN (hypertension)     Past Surgical History:  Procedure Laterality Date   TONSILLECTOMY      Prior to Admission medications   Medication Sig Start Date End Date Taking? Authorizing Provider  ALPRAZolam Duanne Moron) 1 MG tablet alprazolam 1 mg tablet   Yes [provider]  Calcium Citrate-Vitamin D (CALCIUM CITRATE + D PO) Take 1 tablet by mouth daily.   Yes [provider]  cetirizine (ZYRTEC) 10 MG tablet Take 10 mg by mouth daily.   Yes [provider]  Cholecalciferol (VITAMIN D3) 1000 units CAPS Take 1 capsule by mouth daily.   Yes [provider]  Multiple Vitamins-Iron (MULTIVITAMIN/IRON PO) Take 1 tablet by mouth daily.   Yes [provider]  olmesartan-hydrochlorothiazide (BENICAR HCT) 20-12.5 MG tablet Take 1 tablet by mouth daily.   Yes [provider]  Probiotic Product (PROBIOTIC PO) Take 1 tablet by mouth daily.   Yes [provider]  VIENVA 0.1-20 MG-MCG tablet Take 1 tablet by mouth daily. 11/30/16  Yes [provider]  vitamin B-12 (CYANOCOBALAMIN) 1000 MCG tablet Take 1,000 mcg by mouth daily.   Yes [provider]  ibuprofen (IBU) 600 MG tablet     [provider]    Current Outpatient Medications  Medication Sig Dispense Refill   ALPRAZolam (XANAX) 1 MG tablet alprazolam 1 mg tablet     Calcium Citrate-Vitamin D (CALCIUM  CITRATE + D PO) Take 1 tablet by mouth daily.     cetirizine (ZYRTEC) 10 MG tablet Take 10 mg by mouth daily.     Cholecalciferol (VITAMIN D3) 1000 units CAPS Take 1 capsule by mouth daily.     Multiple Vitamins-Iron (MULTIVITAMIN/IRON PO) Take 1 tablet by mouth daily.     olmesartan-hydrochlorothiazide (BENICAR HCT) 20-12.5 MG tablet Take 1 tablet by mouth daily.     Probiotic Product (PROBIOTIC PO) Take 1 tablet by mouth daily.     VIENVA 0.1-20 MG-MCG tablet Take 1 tablet by mouth daily.     vitamin B-12 (CYANOCOBALAMIN) 1000 MCG tablet Take 1,000 mcg by mouth daily.     ibuprofen (IBU) 600 MG tablet      Current Facility-Administered Medications  Medication Dose Route Frequency Provider Last Rate Last Admin   0.9 %  sodium chloride infusion  500 mL Intravenous Continuous Milus Banister, MD       0.9 %  sodium chloride infusion  500 mL Intravenous Once Jackquline Denmark, MD        Allergies as of 06/06/2022   (No Known Allergies)    Family History  Problem Relation Age of Onset   Lung cancer Father    Colon cancer Brother 52   Heart disease Paternal Aunt    Prostate cancer Paternal Uncle    Brain cancer Paternal Uncle        x 2  Lung cancer Maternal Grandmother    Liver cancer Maternal Grandfather    Heart disease Paternal Grandfather    Diabetes Other        maternal greatgraandmother   Breast cancer Neg Hx    Esophageal cancer Neg Hx    Stomach cancer Neg Hx    Ulcerative colitis Neg Hx     Social History   Socioeconomic History   Marital status: Unknown    Spouse name: Not on file   Number of children: 1   Years of education: Not on file   Highest education level: Not on file  Occupational History   Occupation: Public affairs consultant  Tobacco Use   Smoking status: Never   Smokeless tobacco: Never  Substance and Sexual Activity   Alcohol use: Yes    Comment: rarely   Drug use: No   Sexual activity: Not on file  Other Topics Concern   Not on file   Social History Narrative   Not on file   Social Determinants of Health   Financial Resource Strain: Not on file  Food Insecurity: Not on file  Transportation Needs: Not on file  Physical Activity: Not on file  Stress: Not on file  Social Connections: Not on file  Intimate Partner Violence: Not on file    Review of Systems: Positive for none All other review of systems negative except as mentioned in the HPI.  Physical Exam: Vital signs in last 24 hours: '@VSRANGES'$ @   General:   Alert,  Well-developed, well-nourished, pleasant and cooperative in NAD Lungs:  Clear throughout to auscultation.   Heart:  Regular rate and rhythm; no murmurs, clicks, rubs,  or gallops. Abdomen:  Soft, nontender and nondistended. Normal bowel sounds.   Neuro/Psych:  Alert and cooperative. Normal mood and affect. A and O x 3    No significant changes were identified.  The patient continues to be an appropriate candidate for the planned procedure and anesthesia.   Carmell Austria, MD. [facility] 06/06/2022 10:10 AM@

## 2022-06-07 ENCOUNTER — Telehealth: Payer: Self-pay

## 2022-06-07 NOTE — Telephone Encounter (Signed)
No answer, left message to call if having any issues or concerns, B.Trayquan Kolakowski RN 

## 2022-11-25 ENCOUNTER — Other Ambulatory Visit: Payer: Self-pay | Admitting: Obstetrics and Gynecology

## 2022-11-25 DIAGNOSIS — Z1231 Encounter for screening mammogram for malignant neoplasm of breast: Secondary | ICD-10-CM

## 2023-01-09 ENCOUNTER — Ambulatory Visit
Admission: RE | Admit: 2023-01-09 | Discharge: 2023-01-09 | Disposition: A | Discharge status: Home / Self Care | Payer: BC Managed Care – PPO | Source: Ambulatory Visit | Attending: Obstetrics and Gynecology | Admitting: Obstetrics and Gynecology

## 2023-01-09 DIAGNOSIS — Z1231 Encounter for screening mammogram for malignant neoplasm of breast: Secondary | ICD-10-CM

## 2023-01-25 ENCOUNTER — Other Ambulatory Visit (HOSPITAL_BASED_OUTPATIENT_CLINIC_OR_DEPARTMENT_OTHER): Payer: Self-pay

## 2023-01-25 MED ORDER — ZEPBOUND 5 MG/0.5ML ~~LOC~~ SOAJ
5.0000 mg | SUBCUTANEOUS | 0 refills | Status: DC
  Filled 2023-01-25: qty 2, 28d supply, fill #0

## 2023-01-25 MED ORDER — ZEPBOUND 2.5 MG/0.5ML ~~LOC~~ SOAJ
2.5000 mg | SUBCUTANEOUS | 0 refills | Status: AC
  Filled 2023-01-25: qty 2, 28d supply, fill #0

## 2023-02-15 ENCOUNTER — Other Ambulatory Visit (HOSPITAL_BASED_OUTPATIENT_CLINIC_OR_DEPARTMENT_OTHER): Payer: Self-pay

## 2023-02-22 ENCOUNTER — Other Ambulatory Visit (HOSPITAL_BASED_OUTPATIENT_CLINIC_OR_DEPARTMENT_OTHER): Payer: Self-pay

## 2023-02-23 ENCOUNTER — Other Ambulatory Visit (HOSPITAL_BASED_OUTPATIENT_CLINIC_OR_DEPARTMENT_OTHER): Payer: Self-pay

## 2023-02-23 ENCOUNTER — Other Ambulatory Visit: Payer: Self-pay

## 2023-02-23 MED ORDER — ZEPBOUND 5 MG/0.5ML ~~LOC~~ SOAJ
5.0000 mg | SUBCUTANEOUS | 2 refills | Status: AC
  Filled 2023-02-23 (×2): qty 2, 28d supply, fill #0
  Filled 2023-03-23: qty 2, 28d supply, fill #1
  Filled 2023-04-18: qty 2, 28d supply, fill #2

## 2023-02-23 MED ORDER — ZEPBOUND 5 MG/0.5ML ~~LOC~~ SOAJ
5.0000 mg | SUBCUTANEOUS | 2 refills | Status: DC
  Filled 2023-02-23: qty 2, 28d supply, fill #0

## 2023-02-24 ENCOUNTER — Other Ambulatory Visit (HOSPITAL_BASED_OUTPATIENT_CLINIC_OR_DEPARTMENT_OTHER): Payer: Self-pay

## 2023-02-26 ENCOUNTER — Other Ambulatory Visit (HOSPITAL_BASED_OUTPATIENT_CLINIC_OR_DEPARTMENT_OTHER): Payer: Self-pay

## 2023-02-26 MED ORDER — ZEPBOUND 5 MG/0.5ML ~~LOC~~ SOAJ
5.0000 mg | SUBCUTANEOUS | 0 refills | Status: DC
  Filled 2023-05-13 – 2023-05-15 (×2): qty 2, 28d supply, fill #0

## 2023-03-02 ENCOUNTER — Other Ambulatory Visit (HOSPITAL_BASED_OUTPATIENT_CLINIC_OR_DEPARTMENT_OTHER): Payer: Self-pay

## 2023-04-04 ENCOUNTER — Other Ambulatory Visit (HOSPITAL_BASED_OUTPATIENT_CLINIC_OR_DEPARTMENT_OTHER): Payer: Self-pay

## 2023-04-04 MED ORDER — ZEPBOUND 5 MG/0.5ML ~~LOC~~ SOAJ
5.0000 mg | SUBCUTANEOUS | 2 refills | Status: DC
  Filled 2023-04-04: qty 2, 28d supply, fill #0

## 2023-04-07 ENCOUNTER — Other Ambulatory Visit (HOSPITAL_BASED_OUTPATIENT_CLINIC_OR_DEPARTMENT_OTHER): Payer: Self-pay

## 2023-04-07 MED ORDER — LEVOTHYROXINE SODIUM 75 MCG PO TABS
75.0000 ug | ORAL_TABLET | Freq: Every day | ORAL | 1 refills | Status: DC
  Filled 2023-04-07: qty 90, 90d supply, fill #0
  Filled 2023-05-13: qty 90, 90d supply, fill #1

## 2023-04-08 ENCOUNTER — Other Ambulatory Visit (HOSPITAL_BASED_OUTPATIENT_CLINIC_OR_DEPARTMENT_OTHER): Payer: Self-pay

## 2023-04-11 ENCOUNTER — Other Ambulatory Visit (HOSPITAL_BASED_OUTPATIENT_CLINIC_OR_DEPARTMENT_OTHER): Payer: Self-pay

## 2023-05-13 ENCOUNTER — Other Ambulatory Visit (HOSPITAL_BASED_OUTPATIENT_CLINIC_OR_DEPARTMENT_OTHER): Payer: Self-pay

## 2023-05-15 ENCOUNTER — Other Ambulatory Visit (HOSPITAL_BASED_OUTPATIENT_CLINIC_OR_DEPARTMENT_OTHER): Payer: Self-pay

## 2023-05-15 ENCOUNTER — Encounter (HOSPITAL_BASED_OUTPATIENT_CLINIC_OR_DEPARTMENT_OTHER): Payer: Self-pay

## 2023-05-16 ENCOUNTER — Other Ambulatory Visit: Payer: Self-pay

## 2023-05-22 ENCOUNTER — Encounter (HOSPITAL_BASED_OUTPATIENT_CLINIC_OR_DEPARTMENT_OTHER): Payer: Self-pay

## 2023-05-22 ENCOUNTER — Other Ambulatory Visit (HOSPITAL_BASED_OUTPATIENT_CLINIC_OR_DEPARTMENT_OTHER): Payer: Self-pay

## 2023-05-22 MED ORDER — ZEPBOUND 7.5 MG/0.5ML ~~LOC~~ SOAJ
7.5000 mg | SUBCUTANEOUS | 2 refills | Status: DC
  Filled 2023-05-22: qty 2, 28d supply, fill #0
  Filled 2023-07-06: qty 2, 28d supply, fill #1
  Filled 2023-08-01: qty 2, 28d supply, fill #2

## 2023-05-22 MED ORDER — ONDANSETRON 8 MG PO TBDP
ORAL_TABLET | ORAL | 0 refills | Status: DC
  Filled 2023-05-22: qty 20, 7d supply, fill #0

## 2023-05-23 ENCOUNTER — Other Ambulatory Visit (HOSPITAL_BASED_OUTPATIENT_CLINIC_OR_DEPARTMENT_OTHER): Payer: Self-pay

## 2023-05-23 MED ORDER — LEVOTHYROXINE SODIUM 88 MCG PO TABS
88.0000 ug | ORAL_TABLET | Freq: Every day | ORAL | 3 refills | Status: DC
  Filled 2023-05-23: qty 30, 30d supply, fill #0
  Filled 2023-06-18: qty 30, 30d supply, fill #1
  Filled 2023-07-28: qty 30, 30d supply, fill #2
  Filled 2024-02-22: qty 30, 30d supply, fill #3

## 2023-05-24 ENCOUNTER — Other Ambulatory Visit (HOSPITAL_BASED_OUTPATIENT_CLINIC_OR_DEPARTMENT_OTHER): Payer: Self-pay

## 2023-05-31 ENCOUNTER — Other Ambulatory Visit (HOSPITAL_BASED_OUTPATIENT_CLINIC_OR_DEPARTMENT_OTHER): Payer: Self-pay

## 2023-06-02 ENCOUNTER — Other Ambulatory Visit (HOSPITAL_BASED_OUTPATIENT_CLINIC_OR_DEPARTMENT_OTHER): Payer: Self-pay

## 2023-06-06 ENCOUNTER — Other Ambulatory Visit (HOSPITAL_BASED_OUTPATIENT_CLINIC_OR_DEPARTMENT_OTHER): Payer: Self-pay

## 2023-06-12 ENCOUNTER — Other Ambulatory Visit (HOSPITAL_BASED_OUTPATIENT_CLINIC_OR_DEPARTMENT_OTHER): Payer: Self-pay

## 2023-06-19 ENCOUNTER — Other Ambulatory Visit (HOSPITAL_BASED_OUTPATIENT_CLINIC_OR_DEPARTMENT_OTHER): Payer: Self-pay

## 2023-07-28 ENCOUNTER — Other Ambulatory Visit: Payer: Self-pay

## 2023-08-01 ENCOUNTER — Other Ambulatory Visit (HOSPITAL_BASED_OUTPATIENT_CLINIC_OR_DEPARTMENT_OTHER): Payer: Self-pay

## 2023-08-02 ENCOUNTER — Other Ambulatory Visit (HOSPITAL_BASED_OUTPATIENT_CLINIC_OR_DEPARTMENT_OTHER): Payer: Self-pay

## 2023-08-02 MED ORDER — SERTRALINE HCL 50 MG PO TABS
50.0000 mg | ORAL_TABLET | Freq: Every day | ORAL | 1 refills | Status: DC
  Filled 2023-08-02: qty 90, 90d supply, fill #0
  Filled 2023-10-30: qty 90, 90d supply, fill #1

## 2023-08-02 MED ORDER — LEVOTHYROXINE SODIUM 88 MCG PO TABS
88.0000 ug | ORAL_TABLET | Freq: Every day | ORAL | 1 refills | Status: DC
  Filled 2023-08-02 – 2023-08-28 (×2): qty 90, 90d supply, fill #0
  Filled 2023-11-23: qty 90, 90d supply, fill #1

## 2023-08-02 MED ORDER — ZEPBOUND 7.5 MG/0.5ML ~~LOC~~ SOAJ
7.5000 mg | SUBCUTANEOUS | 2 refills | Status: AC
  Filled 2023-08-02 – 2023-08-28 (×2): qty 2, 28d supply, fill #0
  Filled 2023-09-25: qty 2, 28d supply, fill #1
  Filled 2023-10-19 – 2024-07-29 (×2): qty 2, 28d supply, fill #2

## 2023-08-17 ENCOUNTER — Other Ambulatory Visit (HOSPITAL_BASED_OUTPATIENT_CLINIC_OR_DEPARTMENT_OTHER): Payer: Self-pay

## 2023-08-17 MED ORDER — METHYLPREDNISOLONE 4 MG PO TBPK
ORAL_TABLET | ORAL | 0 refills | Status: AC
  Filled 2023-08-17: qty 21, 6d supply, fill #0

## 2023-08-28 ENCOUNTER — Other Ambulatory Visit (HOSPITAL_BASED_OUTPATIENT_CLINIC_OR_DEPARTMENT_OTHER): Payer: Self-pay

## 2023-09-04 ENCOUNTER — Other Ambulatory Visit (HOSPITAL_BASED_OUTPATIENT_CLINIC_OR_DEPARTMENT_OTHER): Payer: Self-pay

## 2023-09-04 MED ORDER — ALPRAZOLAM 1 MG PO TABS
1.0000 mg | ORAL_TABLET | Freq: Every day | ORAL | 2 refills | Status: DC
  Filled 2023-09-04: qty 30, 30d supply, fill #0
  Filled 2023-10-19 – 2023-10-23 (×2): qty 30, 30d supply, fill #1
  Filled 2023-11-23: qty 30, 30d supply, fill #2

## 2023-09-05 ENCOUNTER — Other Ambulatory Visit (HOSPITAL_BASED_OUTPATIENT_CLINIC_OR_DEPARTMENT_OTHER): Payer: Self-pay

## 2023-09-25 ENCOUNTER — Other Ambulatory Visit (HOSPITAL_BASED_OUTPATIENT_CLINIC_OR_DEPARTMENT_OTHER): Payer: Self-pay

## 2023-10-04 ENCOUNTER — Other Ambulatory Visit (HOSPITAL_BASED_OUTPATIENT_CLINIC_OR_DEPARTMENT_OTHER): Payer: Self-pay

## 2023-10-04 MED ORDER — SHINGRIX 50 MCG/0.5ML IM SUSR
0.5000 mL | Freq: Once | INTRAMUSCULAR | 0 refills | Status: AC
  Filled 2023-10-04: qty 0.5, 1d supply, fill #0

## 2023-10-19 ENCOUNTER — Other Ambulatory Visit (HOSPITAL_BASED_OUTPATIENT_CLINIC_OR_DEPARTMENT_OTHER): Payer: Self-pay

## 2023-10-23 ENCOUNTER — Other Ambulatory Visit (HOSPITAL_BASED_OUTPATIENT_CLINIC_OR_DEPARTMENT_OTHER): Payer: Self-pay

## 2023-10-23 ENCOUNTER — Encounter (HOSPITAL_BASED_OUTPATIENT_CLINIC_OR_DEPARTMENT_OTHER): Payer: Self-pay

## 2023-11-07 ENCOUNTER — Other Ambulatory Visit (HOSPITAL_BASED_OUTPATIENT_CLINIC_OR_DEPARTMENT_OTHER): Payer: Self-pay

## 2023-11-07 MED ORDER — SERTRALINE HCL 50 MG PO TABS
50.0000 mg | ORAL_TABLET | Freq: Every day | ORAL | 1 refills | Status: DC
  Filled 2024-01-29: qty 90, 90d supply, fill #0
  Filled 2024-04-30: qty 90, 90d supply, fill #1

## 2023-11-07 MED ORDER — ALPRAZOLAM 1 MG PO TABS
1.0000 mg | ORAL_TABLET | Freq: Every day | ORAL | 5 refills | Status: DC
  Filled 2024-01-01: qty 30, 30d supply, fill #0
  Filled 2024-01-29: qty 30, 30d supply, fill #1
  Filled 2024-02-27: qty 30, 30d supply, fill #2
  Filled 2024-03-21 – 2024-03-25 (×2): qty 30, 30d supply, fill #3
  Filled 2024-04-30: qty 30, 30d supply, fill #4

## 2023-11-23 ENCOUNTER — Other Ambulatory Visit: Payer: Self-pay

## 2023-12-19 ENCOUNTER — Other Ambulatory Visit: Payer: Self-pay | Admitting: Obstetrics and Gynecology

## 2023-12-19 DIAGNOSIS — Z1231 Encounter for screening mammogram for malignant neoplasm of breast: Secondary | ICD-10-CM

## 2024-01-01 ENCOUNTER — Other Ambulatory Visit (HOSPITAL_BASED_OUTPATIENT_CLINIC_OR_DEPARTMENT_OTHER): Payer: Self-pay

## 2024-01-01 MED ORDER — PROGESTERONE MICRONIZED 100 MG PO CAPS
100.0000 mg | ORAL_CAPSULE | Freq: Every day | ORAL | 0 refills | Status: DC
  Filled 2024-01-01: qty 90, 90d supply, fill #0

## 2024-01-01 MED ORDER — ESTRADIOL 0.05 MG/24HR TD PTTW
1.0000 | MEDICATED_PATCH | TRANSDERMAL | 0 refills | Status: DC
  Filled 2024-01-01: qty 24, 84d supply, fill #0

## 2024-01-02 ENCOUNTER — Other Ambulatory Visit (HOSPITAL_BASED_OUTPATIENT_CLINIC_OR_DEPARTMENT_OTHER): Payer: Self-pay

## 2024-01-02 MED ORDER — SHINGRIX 50 MCG/0.5ML IM SUSR
INTRAMUSCULAR | 0 refills | Status: AC
  Filled 2024-01-02: qty 0.5, 1d supply, fill #0

## 2024-01-10 ENCOUNTER — Ambulatory Visit
Admission: RE | Admit: 2024-01-10 | Discharge: 2024-01-10 | Disposition: A | Discharge status: Home / Self Care | Source: Ambulatory Visit | Attending: Obstetrics and Gynecology | Admitting: Obstetrics and Gynecology

## 2024-01-10 DIAGNOSIS — Z1231 Encounter for screening mammogram for malignant neoplasm of breast: Secondary | ICD-10-CM

## 2024-01-28 IMAGING — MG MM DIGITAL SCREENING BILAT W/ TOMO AND CAD
8 series · 8 of 24 positions shown · non-contrast
Comparison: Previous exam(s).

CLINICAL DATA: Screening.

EXAM:
DIGITAL SCREENING BILATERAL MAMMOGRAM WITH TOMOSYNTHESIS AND CAD
TECHNIQUE: Bilateral screening digital craniocaudal and mediolateral oblique
mammograms were obtained. Bilateral screening digital breast
tomosynthesis was performed. The images were evaluated with
computer-aided detection.

[L MLO synth-2D]
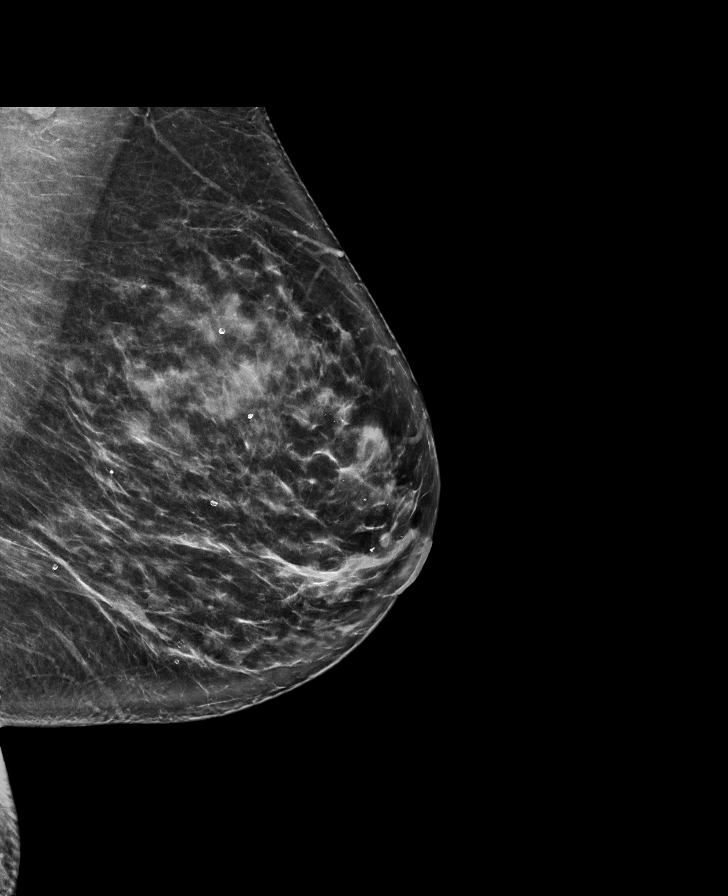

[L CC synth-2D]
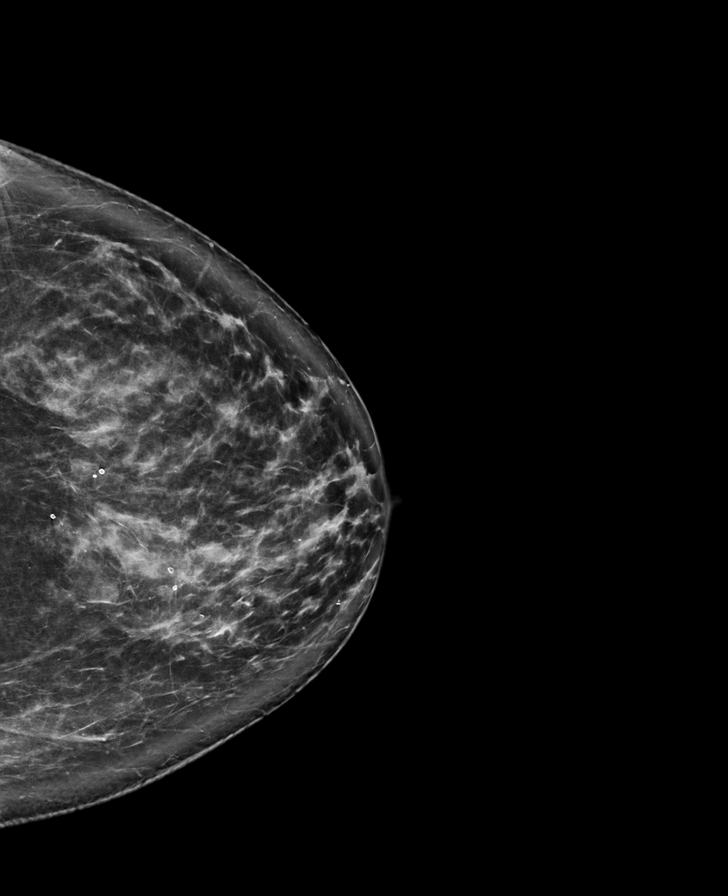

[R MLO synth-2D]
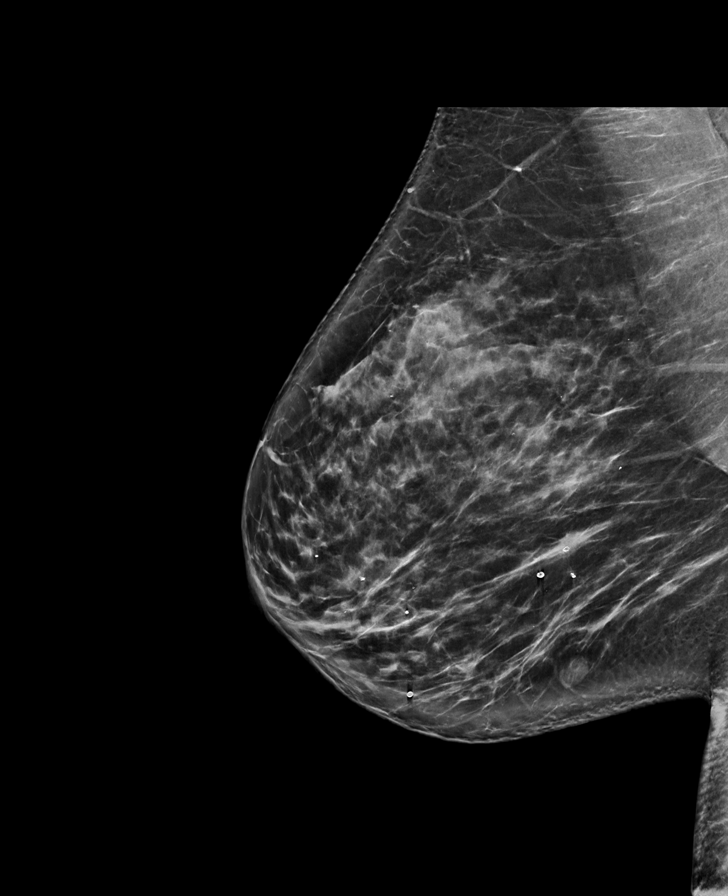

[R CC synth-2D]
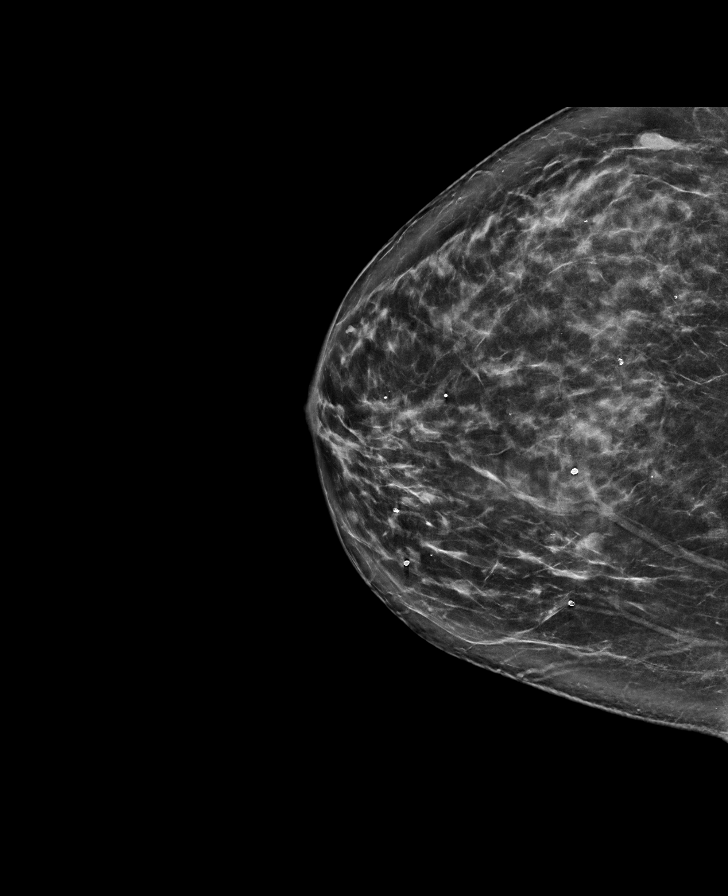

[R MLO tomo · tomo slice 45/88.0]
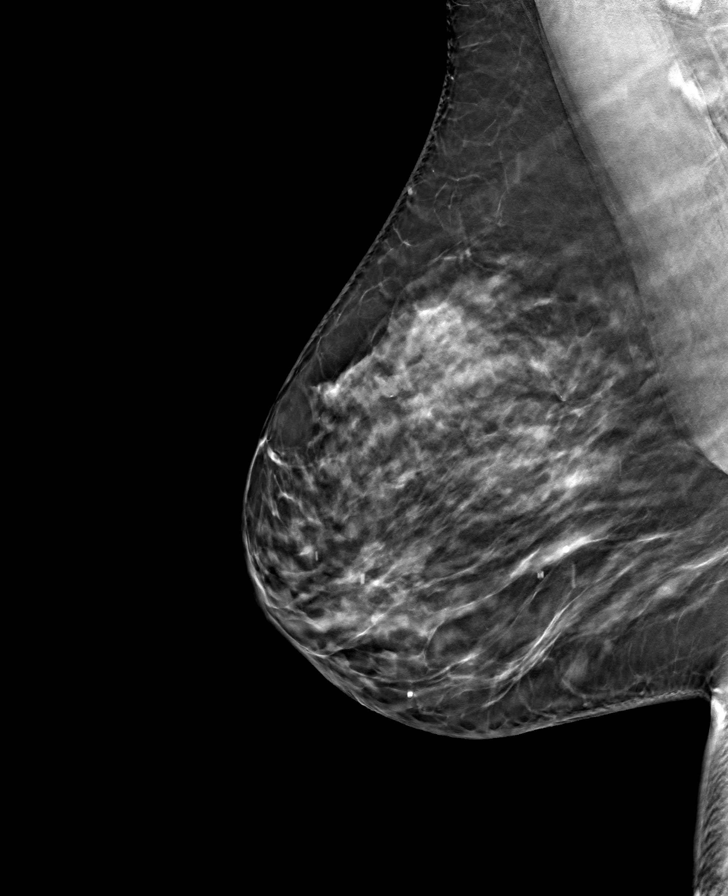

[L MLO tomo · tomo slice 42/83.0]
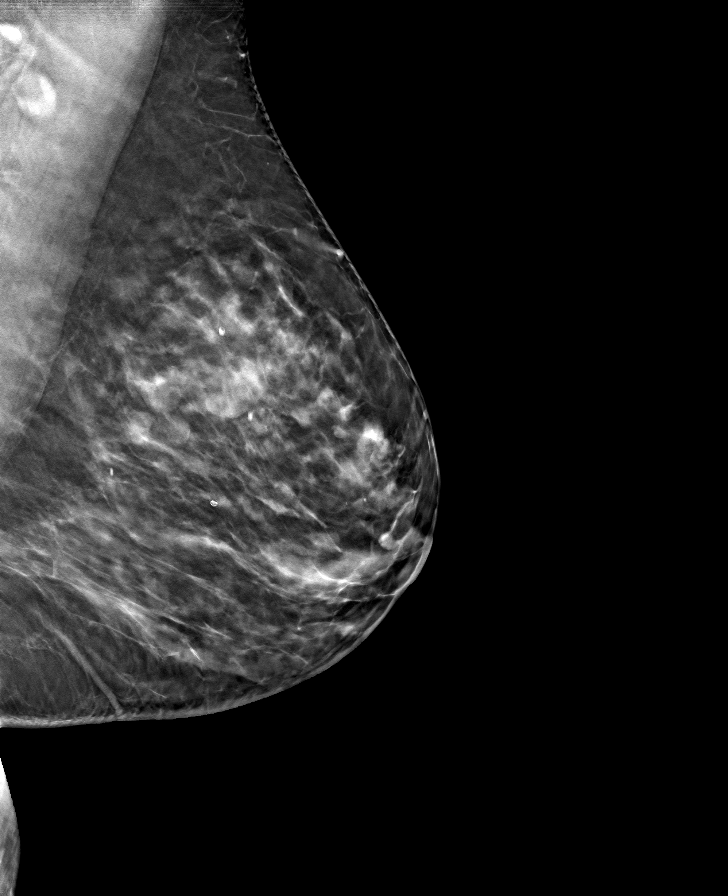

[L CC tomo · tomo slice 45/90.0]
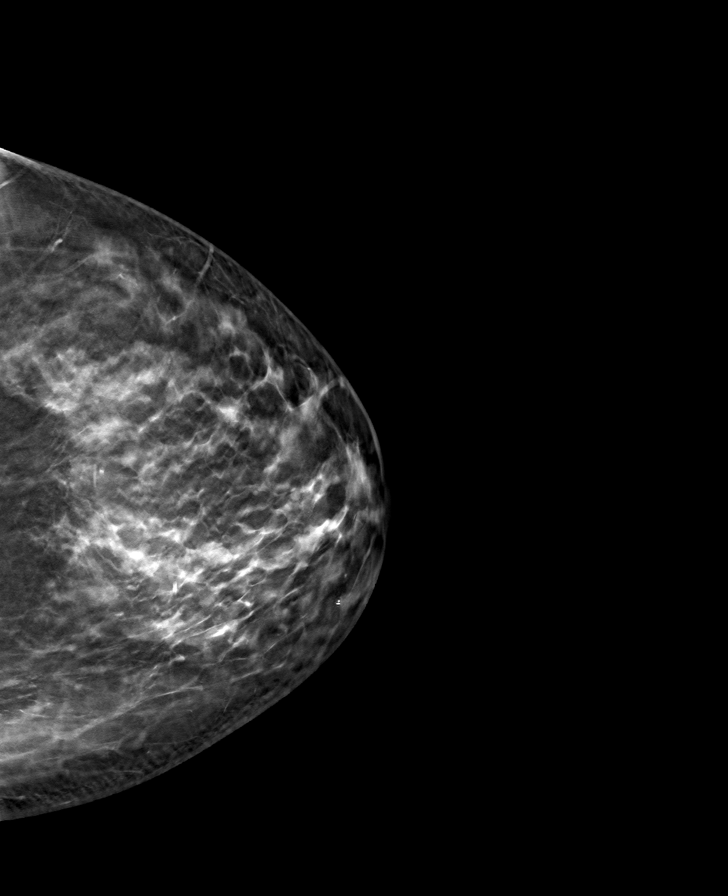

[R CC tomo · tomo slice 40/79.0]
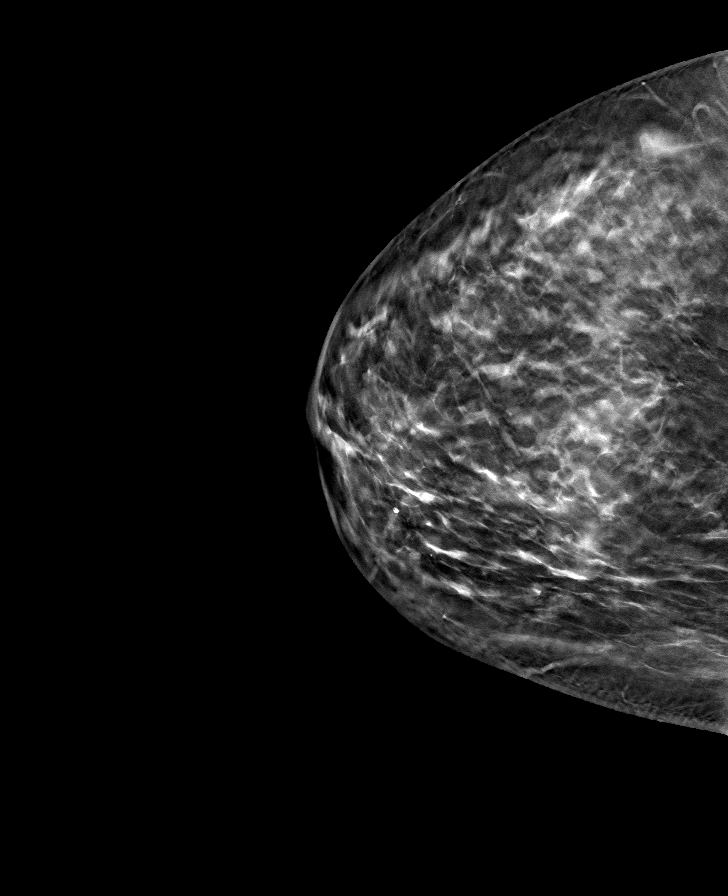

[8 of 24 positions shown; findings below may reference images not displayed]

ACR Breast Density Category c: The breast tissue is heterogeneously
dense, which may obscure small masses.
FINDINGS: In the right breast, a possible mass within the posterior far OUTER
RIGHT breast warrants further evaluation. In the left breast, no
findings suspicious for malignancy.
IMPRESSION: Further evaluation is suggested for possible mass in the right
breast.

RECOMMENDATION:
Ultrasound of the right breast. (Code:EW-W-TT3)

The patient will be contacted regarding the findings, and additional
imaging will be scheduled.

BI-RADS CATEGORY  0: Incomplete. Need additional imaging evaluation
and/or prior mammograms for comparison.

## 2024-01-29 ENCOUNTER — Other Ambulatory Visit (HOSPITAL_BASED_OUTPATIENT_CLINIC_OR_DEPARTMENT_OTHER): Payer: Self-pay

## 2024-02-14 IMAGING — US US BREAST*R* LIMITED INC AXILLA
1 series · 7 of 7 positions shown · non-contrast
Comparison: Previous exam(s).

CLINICAL DATA: Recall from screening mammography, possible new mass
involving the outer RIGHT breast.

EXAM:
ULTRASOUND OF THE RIGHT BREAST

[Series 1: us breast*right* limited inc axilla · 0.06mm/px · 7 acquisitions, 7 frames shown]
[im 1/7]
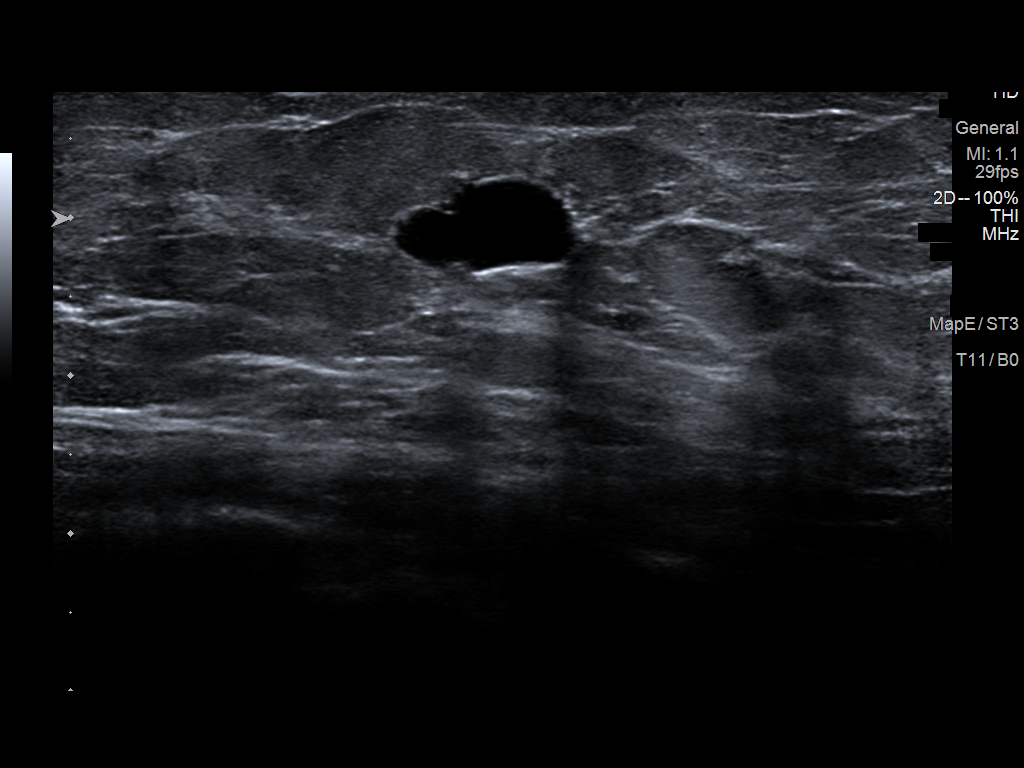
[im 2/7]
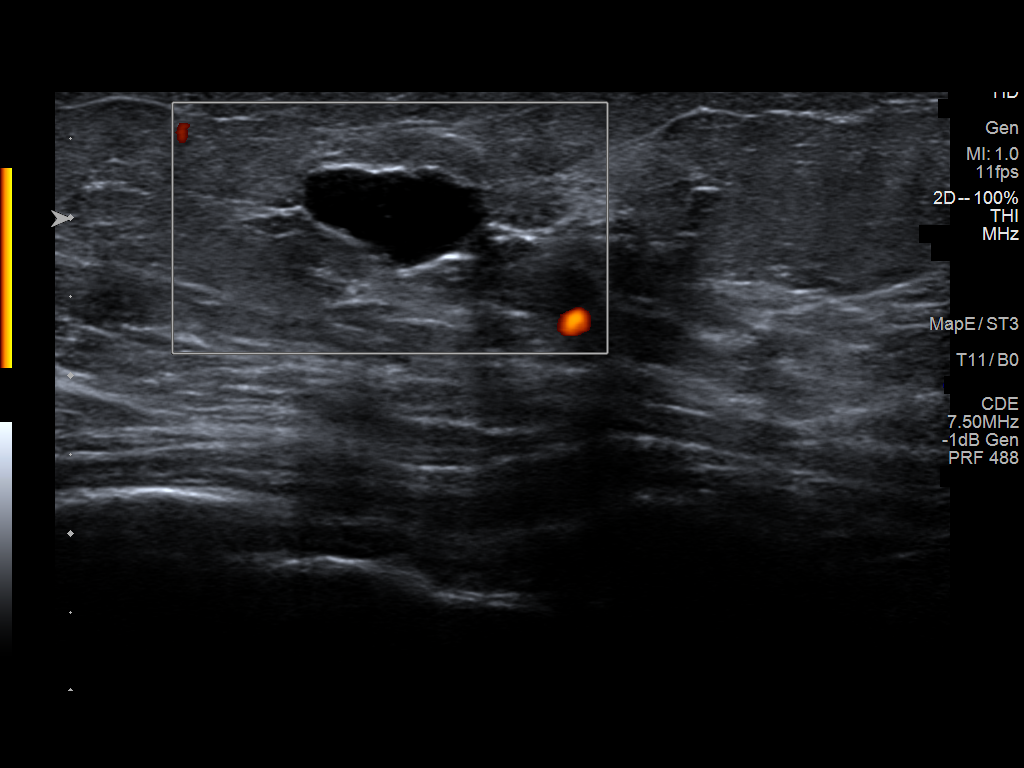
[im 3/7]
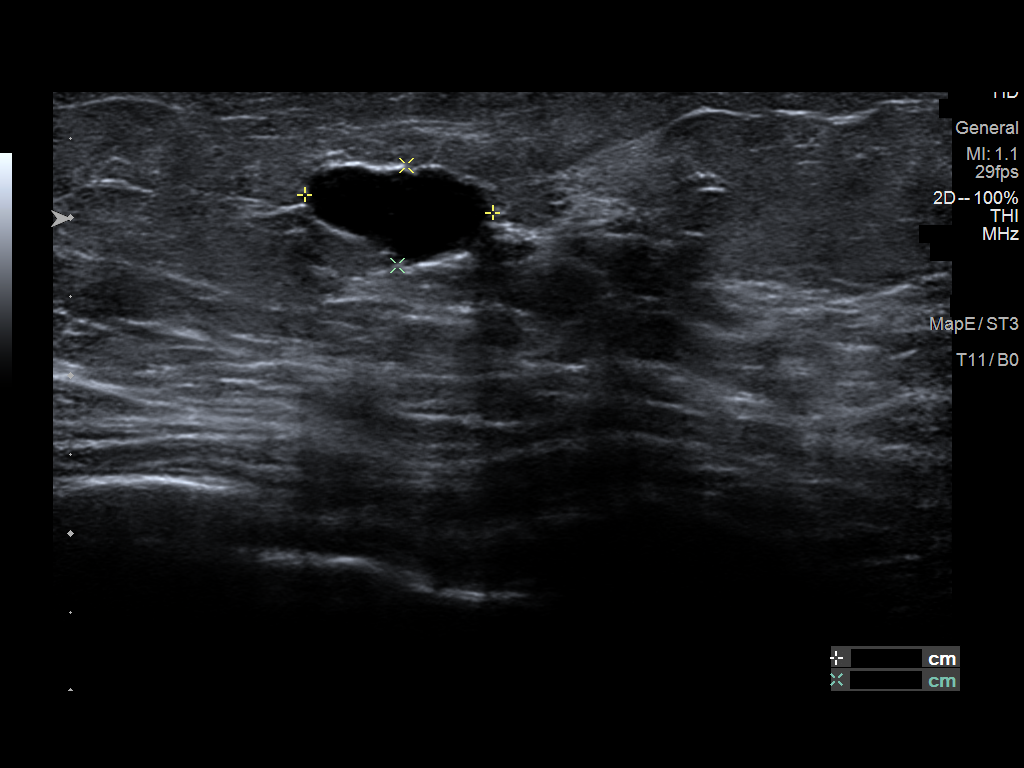
[im 4/7]
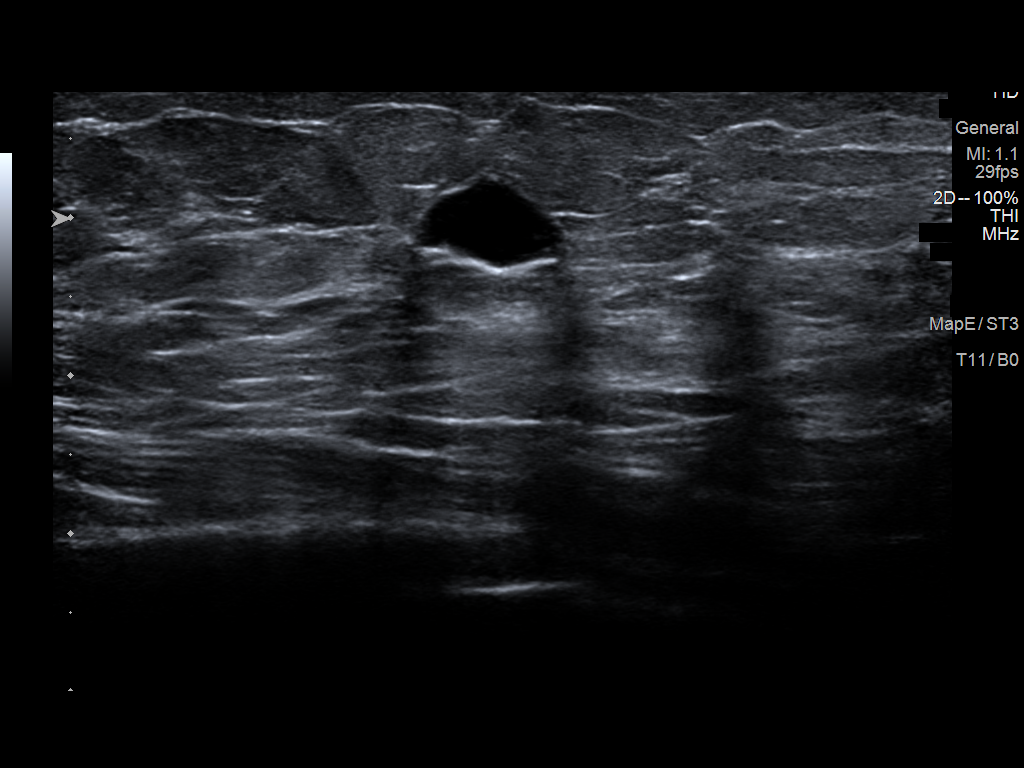
[im 5/7]
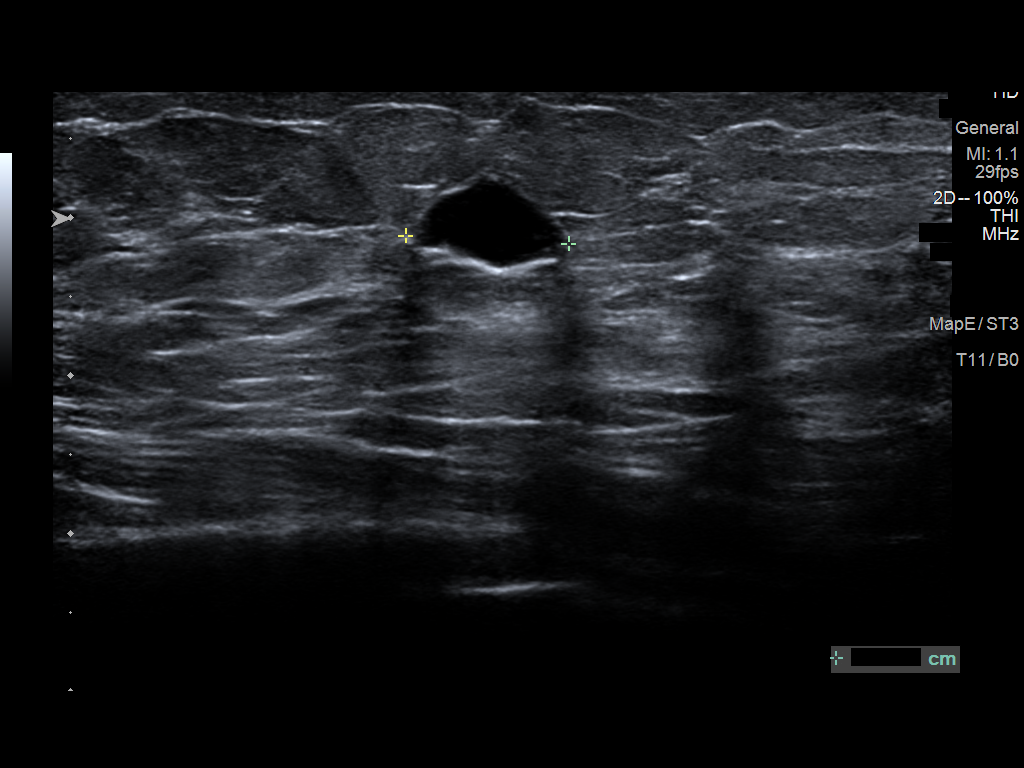
[im 6/7]
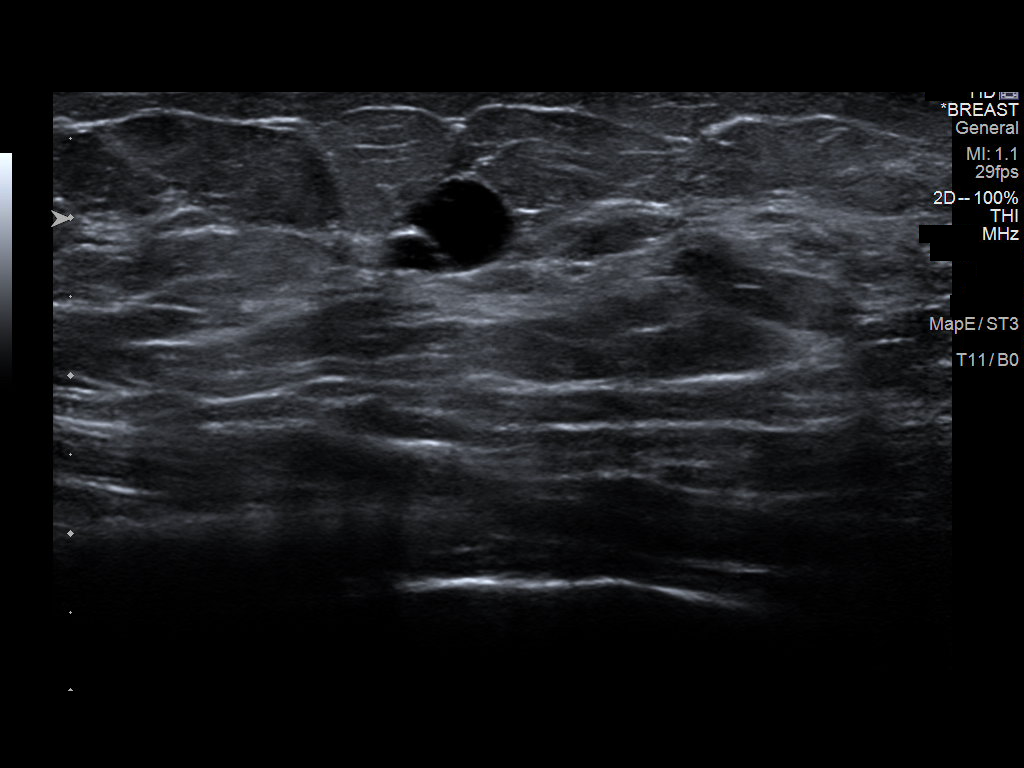
[im 7/7]
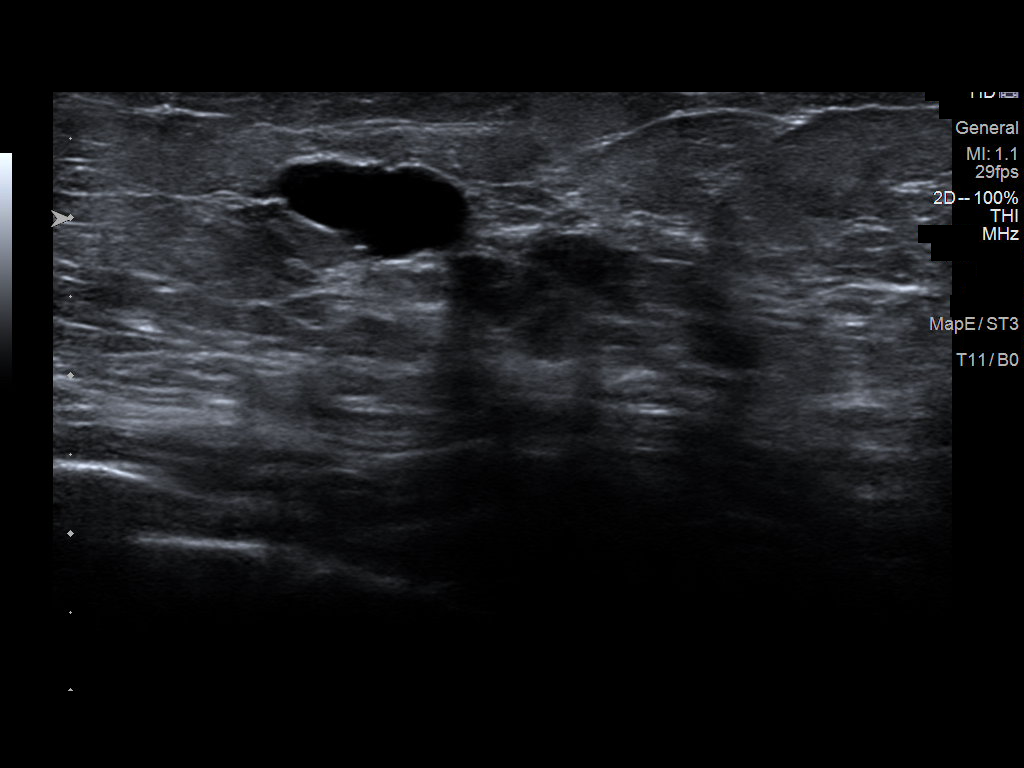

[7 of 7 positions shown; findings below may reference images not displayed]

FINDINGS: Targeted ultrasound is performed, showing an oval circumscribed
parallel anechoic mass at the 9:30 o'clock position 10 cm from the
nipple measuring approximately 1.2 x 0.6 x 1.0 cm, demonstrating
posterior acoustic enhancement and no internal power Doppler flow,
accounting for the screening mammographic finding. No suspicious
solid mass or abnormal acoustic shadowing is identified.
IMPRESSION: Benign 1.2 cm simple cyst at the 9:30 o'clock position 10 cm from
the nipple which accounts for the screening mammographic finding.

RECOMMENDATION:
Screening mammogram in one year.(Code:OU-E-P8L)

I have discussed the findings and recommendations with the patient.
If applicable, a reminder letter will be sent to the patient
regarding the next appointment.

BI-RADS CATEGORY  2: Benign.

## 2024-02-22 ENCOUNTER — Other Ambulatory Visit: Payer: Self-pay

## 2024-02-22 ENCOUNTER — Encounter (HOSPITAL_BASED_OUTPATIENT_CLINIC_OR_DEPARTMENT_OTHER): Payer: Self-pay

## 2024-02-22 ENCOUNTER — Other Ambulatory Visit (HOSPITAL_BASED_OUTPATIENT_CLINIC_OR_DEPARTMENT_OTHER): Payer: Self-pay

## 2024-02-23 ENCOUNTER — Other Ambulatory Visit (HOSPITAL_BASED_OUTPATIENT_CLINIC_OR_DEPARTMENT_OTHER): Payer: Self-pay

## 2024-02-23 MED ORDER — LEVOTHYROXINE SODIUM 88 MCG PO TABS
88.0000 ug | ORAL_TABLET | Freq: Every day | ORAL | 1 refills | Status: DC
  Filled 2024-02-23 – 2024-03-21 (×2): qty 90, 90d supply, fill #0

## 2024-02-27 ENCOUNTER — Other Ambulatory Visit: Payer: Self-pay

## 2024-02-28 ENCOUNTER — Other Ambulatory Visit (HOSPITAL_BASED_OUTPATIENT_CLINIC_OR_DEPARTMENT_OTHER): Payer: Self-pay

## 2024-02-28 MED ORDER — CLOBETASOL PROPIONATE 0.05 % EX SOLN
CUTANEOUS | 2 refills | Status: DC
  Filled 2024-02-28: qty 25, 30d supply, fill #0
  Filled 2024-06-29: qty 25, 30d supply, fill #1

## 2024-02-29 ENCOUNTER — Other Ambulatory Visit: Payer: Self-pay

## 2024-02-29 ENCOUNTER — Other Ambulatory Visit (HOSPITAL_BASED_OUTPATIENT_CLINIC_OR_DEPARTMENT_OTHER): Payer: Self-pay

## 2024-02-29 MED ORDER — LEVOTHYROXINE SODIUM 88 MCG PO TABS
88.0000 ug | ORAL_TABLET | Freq: Every day | ORAL | 3 refills | Status: AC
  Filled 2024-02-29 – 2024-06-29 (×2): qty 90, 90d supply, fill #0

## 2024-02-29 MED ORDER — ALPRAZOLAM 1 MG PO TABS
1.0000 mg | ORAL_TABLET | Freq: Every day | ORAL | 3 refills | Status: DC
  Filled 2024-06-11: qty 30, 30d supply, fill #0
  Filled 2024-07-10: qty 30, 30d supply, fill #1
  Filled 2024-08-13: qty 30, 30d supply, fill #2

## 2024-02-29 MED ORDER — OLMESARTAN MEDOXOMIL-HCTZ 20-12.5 MG PO TABS
1.0000 | ORAL_TABLET | Freq: Every day | ORAL | 3 refills | Status: AC
  Filled 2024-02-29 – 2024-03-13 (×2): qty 90, 90d supply, fill #0
  Filled 2024-06-11: qty 90, 90d supply, fill #1

## 2024-03-12 ENCOUNTER — Other Ambulatory Visit (HOSPITAL_BASED_OUTPATIENT_CLINIC_OR_DEPARTMENT_OTHER): Payer: Self-pay

## 2024-03-13 ENCOUNTER — Other Ambulatory Visit (HOSPITAL_BASED_OUTPATIENT_CLINIC_OR_DEPARTMENT_OTHER): Payer: Self-pay

## 2024-03-13 MED ORDER — ESTRADIOL 0.05 MG/24HR TD PTTW
1.0000 | MEDICATED_PATCH | TRANSDERMAL | 0 refills | Status: DC
  Filled 2024-03-21: qty 24, 84d supply, fill #0

## 2024-03-13 MED ORDER — PROGESTERONE MICRONIZED 100 MG PO CAPS
100.0000 mg | ORAL_CAPSULE | Freq: Every day | ORAL | 0 refills | Status: DC
  Filled 2024-03-21: qty 90, 90d supply, fill #0

## 2024-03-21 ENCOUNTER — Other Ambulatory Visit (HOSPITAL_BASED_OUTPATIENT_CLINIC_OR_DEPARTMENT_OTHER): Payer: Self-pay

## 2024-03-21 ENCOUNTER — Other Ambulatory Visit (HOSPITAL_COMMUNITY): Payer: Self-pay

## 2024-03-21 ENCOUNTER — Other Ambulatory Visit: Payer: Self-pay

## 2024-04-30 ENCOUNTER — Other Ambulatory Visit: Payer: Self-pay

## 2024-06-11 ENCOUNTER — Other Ambulatory Visit: Payer: Self-pay

## 2024-06-11 ENCOUNTER — Other Ambulatory Visit (HOSPITAL_BASED_OUTPATIENT_CLINIC_OR_DEPARTMENT_OTHER): Payer: Self-pay

## 2024-06-12 ENCOUNTER — Other Ambulatory Visit: Payer: Self-pay

## 2024-06-12 ENCOUNTER — Other Ambulatory Visit (HOSPITAL_BASED_OUTPATIENT_CLINIC_OR_DEPARTMENT_OTHER): Payer: Self-pay

## 2024-06-12 MED ORDER — ESTRADIOL 0.05 MG/24HR TD PTTW
1.0000 | MEDICATED_PATCH | TRANSDERMAL | 1 refills | Status: AC
  Filled 2024-06-12: qty 24, 84d supply, fill #0

## 2024-06-21 ENCOUNTER — Other Ambulatory Visit (HOSPITAL_BASED_OUTPATIENT_CLINIC_OR_DEPARTMENT_OTHER): Payer: Self-pay

## 2024-06-24 ENCOUNTER — Other Ambulatory Visit (HOSPITAL_BASED_OUTPATIENT_CLINIC_OR_DEPARTMENT_OTHER): Payer: Self-pay

## 2024-06-24 MED ORDER — PROGESTERONE MICRONIZED 100 MG PO CAPS
100.0000 mg | ORAL_CAPSULE | Freq: Every day | ORAL | 1 refills | Status: AC
  Filled 2024-06-24: qty 90, 90d supply, fill #0

## 2024-06-29 ENCOUNTER — Other Ambulatory Visit (HOSPITAL_BASED_OUTPATIENT_CLINIC_OR_DEPARTMENT_OTHER): Payer: Self-pay

## 2024-07-10 ENCOUNTER — Other Ambulatory Visit: Payer: Self-pay

## 2024-08-01 ENCOUNTER — Other Ambulatory Visit (HOSPITAL_BASED_OUTPATIENT_CLINIC_OR_DEPARTMENT_OTHER): Payer: Self-pay

## 2024-08-13 ENCOUNTER — Other Ambulatory Visit: Payer: Self-pay

## 2024-08-29 ENCOUNTER — Other Ambulatory Visit (HOSPITAL_BASED_OUTPATIENT_CLINIC_OR_DEPARTMENT_OTHER): Payer: Self-pay

## 2024-08-30 ENCOUNTER — Other Ambulatory Visit (HOSPITAL_BASED_OUTPATIENT_CLINIC_OR_DEPARTMENT_OTHER): Payer: Self-pay

## 2024-08-30 MED ORDER — SERTRALINE HCL 50 MG PO TABS
50.0000 mg | ORAL_TABLET | Freq: Every day | ORAL | 1 refills | Status: DC
  Filled 2024-08-30: qty 30, 30d supply, fill #0

## 2024-09-05 ENCOUNTER — Other Ambulatory Visit (HOSPITAL_BASED_OUTPATIENT_CLINIC_OR_DEPARTMENT_OTHER): Payer: Self-pay

## 2024-09-18 ENCOUNTER — Other Ambulatory Visit (HOSPITAL_BASED_OUTPATIENT_CLINIC_OR_DEPARTMENT_OTHER): Payer: Self-pay

## 2024-09-19 ENCOUNTER — Other Ambulatory Visit (HOSPITAL_BASED_OUTPATIENT_CLINIC_OR_DEPARTMENT_OTHER): Payer: Self-pay

## 2024-09-19 MED ORDER — ALPRAZOLAM 1 MG PO TABS
1.0000 mg | ORAL_TABLET | Freq: Every day | ORAL | 3 refills | Status: AC
  Filled 2024-09-19: qty 30, 30d supply, fill #0

## 2024-10-04 ENCOUNTER — Other Ambulatory Visit (HOSPITAL_BASED_OUTPATIENT_CLINIC_OR_DEPARTMENT_OTHER): Payer: Self-pay

## 2024-10-04 MED ORDER — ROSUVASTATIN CALCIUM 10 MG PO TABS
10.0000 mg | ORAL_TABLET | Freq: Every day | ORAL | 1 refills | Status: AC
  Filled 2024-10-04: qty 30, 30d supply, fill #0
  Filled 2024-11-01: qty 30, 30d supply, fill #1

## 2024-10-07 NOTE — Progress Notes (Unsigned)
 "   Cardiology Clinic Note   Patient Name: Jeanette Stuart Date of Encounter: 10/08/2024  Primary Care Provider:  Cristopher Suzen HERO, NP Primary Cardiologist:  None  Patient Profile    Jeanette Stuart 53 year old female presents to the clinic today for evaluation of her chest tightness.  Past Medical History    Past Medical History:  Diagnosis Date   Allergic rhinitis    Allergy    SEASONAL   Anxiety    C. difficile diarrhea    HTN (hypertension)    Past Surgical History:  Procedure Laterality Date   TONSILLECTOMY      Allergies  Allergies[1]  History of Present Illness    Jeanette Stuart has a PMH of HTN, C. difficile, neoplasm of breast, hypothyroidism, hyperlipidemia, obesity, and anxiety.  She reports a family history of heart attack in her paternal grandfather.  He was in his 40s-50s.  She reports that both her mother and father have hypertension.  She was seen and evaluated by her PCP on 10/01/2024.  During that time she reported intermittent episodes of chest tightness.  Her chest discomfort was in the setting of anxiety.  She was noted to have multiple risk factors for cardiovascular disease.  She reported that she had been gaining some weight over the past month since stopping Zepbound .  Her insurance was long preparing for the medication.  She also noticed some chest pains that had increased more during periods of anxiety.  She reported that her company had been purchased and she had a new role with more responsibility.  She was having a hard time sleeping.  She denied suicidal ideation, delusions, hallucination, and substance use.  She denied abdominal pain, presyncope and syncope.  She was referred to cardiology for further evaluation.  She presents to the clinic today for evaluation and states she has been intermittently having chest discomfort.  Her discomfort she describes as tightness.  It can last for most of the day or he is off when she has less stress.  She was  recently started on Lexapro.  She reports that she is back on Zepbound .  She does note that her discomfort/pain happens in the setting of stress.  She relates increased stress to her work environment.  Her recent EKG with her primary care provider showed sinus rhythm.  She also notes that she was recently started on rosuvastatin  10 mg daily.  Her total cholesterol was noted to be 228.  Her HDL cholesterol was 64 and her LDL was 144.  I encouraged her to increase her physical activity, increase the fiber in her diet and we will give her the mindfulness stress reduction sheet.  Due to her family history and risk factors I will order a coronary CTA and echocardiogram for further prognostication.  Will plan follow-up after testing.  Today she denies chest pain, shortness of breath, lower extremity edema, fatigue, palpitations, melena, hematuria, hemoptysis, diaphoresis, weakness, presyncope, syncope, orthopnea, and PND.   Disposition: Follow-up with Dr. Floretta or me in 2-3 months.  Home Medications    Prior to Admission medications  Medication Sig Start Date End Date Taking? Authorizing Provider  ALPRAZolam  (XANAX ) 1 MG tablet alprazolam  1 mg tablet    [provider]  ALPRAZolam  (XANAX ) 1 MG tablet Take 1 tablet (oral) daily 09/04/23     ALPRAZolam  (XANAX ) 1 MG tablet Take 1 tablet (1 mg total) by mouth daily. 11/07/23     ALPRAZolam  (XANAX ) 1 MG tablet Take 1 tablet (  1 mg total) by mouth at bedtime. 09/18/24     Calcium  Citrate-Vitamin D (CALCIUM  CITRATE + D PO) Take 1 tablet by mouth daily.    [provider]  cetirizine (ZYRTEC) 10 MG tablet Take 10 mg by mouth daily.    [provider]  Cholecalciferol (VITAMIN D3) 1000 units CAPS Take 1 capsule by mouth daily.    [provider]  clobetasol  (TEMOVATE ) 0.05 % external solution Apply topically to scalp once at bedtime. 02/28/24   Elnor Longs, PA-C  estradiol  (VIVELLE -DOT) 0.05 MG/24HR patch Place 1 patch (0.05 mg  total) onto the skin 2 (two) times a week. 01/01/24     estradiol  (VIVELLE -DOT) 0.05 MG/24HR patch Place 1 patch (0.05 mg total) onto the skin 2 (two) times a week. 06/13/24     ibuprofen (IBU) 600 MG tablet     [provider]  levothyroxine  (SYNTHROID ) 88 MCG tablet Take 1 tablet (88 mcg total) by mouth daily. 05/23/23     levothyroxine  (SYNTHROID ) 88 MCG tablet Take 1 tablet (88 mcg total) by mouth daily. 02/23/24     levothyroxine  (SYNTHROID ) 88 MCG tablet Take 1 tablet (88 mcg total) by mouth daily. 02/29/24     Multiple Vitamins-Iron (MULTIVITAMIN/IRON PO) Take 1 tablet by mouth daily.    [provider]  olmesartan -hydrochlorothiazide  (BENICAR  HCT) 20-12.5 MG tablet Take 1 tablet by mouth daily.    [provider]  olmesartan -hydrochlorothiazide  (BENICAR  HCT) 20-12.5 MG tablet Take 1 tablet by mouth daily. 02/29/24     ondansetron  (ZOFRAN -ODT) 8 MG disintegrating tablet Dissolve 1 tablet under the tongue 3 times daily for nausea 05/22/23     Probiotic Product (PROBIOTIC PO) Take 1 tablet by mouth daily.    [provider]  progesterone  (PROMETRIUM ) 100 MG capsule Take 1 capsule (100 mg total) by mouth at bedtime. 06/24/24     rosuvastatin  (CRESTOR ) 10 MG tablet Take 1 tablet (10 mg total) by mouth daily. 10/04/24     sertraline  (ZOLOFT ) 50 MG tablet Take 1 tablet (50 mg total) by mouth daily. 08/30/24     tirzepatide  (ZEPBOUND ) 2.5 MG/0.5ML Pen Inject 2.5 mg into the skin once a week. 01/16/23     tirzepatide  (ZEPBOUND ) 5 MG/0.5ML Pen Inject 5 mg into the skin once a week. 02/23/23     tirzepatide  (ZEPBOUND ) 5 MG/0.5ML Pen Inject 5 mg into the skin once a week. 02/25/23     tirzepatide  (ZEPBOUND ) 5 MG/0.5ML Pen Inject 5 mg into the skin every 7 (seven) days. 04/04/23     tirzepatide  (ZEPBOUND ) 7.5 MG/0.5ML Pen Inject 0.5 mL (7.5 mg) into the skin once a week. 08/02/23     VIENVA 0.1-20 MG-MCG tablet Take 1 tablet by mouth daily. 11/30/16   [provider]  vitamin  B-12 (CYANOCOBALAMIN) 1000 MCG tablet Take 1,000 mcg by mouth daily.    [provider]  Zoster Vaccine Adjuvanted (SHINGRIX ) injection Inject into the muscle. 01/02/24   Luiz Channel, MD    Family History    Family History  Problem Relation Age of Onset   Lung cancer Father    Colon cancer Brother 45   Heart disease Paternal Aunt    Prostate cancer Paternal Uncle    Brain cancer Paternal Uncle        x 2   Lung cancer Maternal Grandmother    Liver cancer Maternal Grandfather    Heart disease Paternal Grandfather    Diabetes Other        maternal greatgraandmother  Breast cancer Neg Hx    Esophageal cancer Neg Hx    Stomach cancer Neg Hx    Ulcerative colitis Neg Hx    She indicated that the status of her father is unknown. She indicated that her brother is alive. She indicated that the status of her maternal grandmother is unknown. She indicated that the status of her maternal grandfather is unknown. She indicated that the status of her paternal grandfather is unknown. She indicated that the status of her paternal aunt is unknown. She indicated that the status of her neg hx is unknown. She indicated that the status of her other is unknown.  Social History    Social History   Socioeconomic History   Marital status: Unknown    Spouse name: Not on file   Number of children: 1   Years of education: Not on file   Highest education level: Not on file  Occupational History   Occupation: special educational needs teacher  Tobacco Use   Smoking status: Never   Smokeless tobacco: Never  Substance and Sexual Activity   Alcohol use: Yes    Comment: rarely   Drug use: No   Sexual activity: Not on file  Other Topics Concern   Not on file  Social History Narrative   Not on file   Social Drivers of Health   Tobacco Use: Low Risk (10/08/2024)   Patient History    Smoking Tobacco Use: Never    Smokeless Tobacco Use: Never    Passive Exposure: Not on file  Financial Resource  Strain: Not on file  Food Insecurity: Not on file  Transportation Needs: Not on file  Physical Activity: Not on file  Stress: Not on file  Social Connections: Not on file  Intimate Partner Violence: Not on file  Depression (EYV7-0): Not on file  Alcohol Screen: Not on file  Housing: Not on file  Utilities: Not on file  Health Literacy: Not on file     Review of Systems    General:  No chills, fever, night sweats or weight changes.  Cardiovascular:  No chest pain, dyspnea on exertion, edema, orthopnea, palpitations, paroxysmal nocturnal dyspnea. Dermatological: No rash, lesions/masses Respiratory: No cough, dyspnea Urologic: No hematuria, dysuria Abdominal:   No nausea, vomiting, diarrhea, bright red blood per rectum, melena, or hematemesis Neurologic:  No visual changes, wkns, changes in mental status. All other systems reviewed and are otherwise negative except as noted above.  Physical Exam    VS:  BP 118/82   Pulse 83   Ht 5' 1 (1.549 m)   Wt 186 lb (84.4 kg)   SpO2 98%   BMI 35.14 kg/m  , BMI Body mass index is 35.14 kg/m. GEN: Well nourished, well developed, in no acute distress. HEENT: normal. Neck: Supple, no JVD, carotid bruits, or masses. Cardiac: RRR, no murmurs, rubs, or gallops. No clubbing, cyanosis, edema.  Radials/DP/PT 2+ and equal bilaterally.  Respiratory:  Respirations regular and unlabored, clear to auscultation bilaterally. GI: Soft, nontender, nondistended, BS + x 4. MS: no deformity or atrophy. Skin: warm and dry, no rash. Neuro:  Strength and sensation are intact. Psych: Normal affect.  Accessory Clinical Findings    Recent Labs: No results found for requested labs within last 365 days.   Recent Lipid Panel No results found for: CHOL, TRIG, HDL, CHOLHDL, VLDL, LDLCALC, LDLDIRECT       ECG personally reviewed by me today-none today.  EKG 10/01/2024 normal sinus rhythm no ectopy 70 bpm  Assessment & Plan    1.  Chest tightness-Notes episodes of chest tightness with increased stress.  She notes increase stress related to her work. Heart healthy low-sodium diet Increase physical activity as tolerated Mindfulness stress reduction Coronary CTA, echocardiogram   Essential hypertension-BP today 118/82. Maintain blood pressure log Heart healthy low-sodium diet Continue olmesartan , HCTZ  Hyperlipidemia-LDL 144 on 12/25. High-fiber diet Continue rosuvastatin   Disposition: Follow-up with Dr. Floretta or me in 2 months.   Josefa HERO. Rifka Ramey NP-C     10/08/2024, 3:45 PM [facility] [address] 5th Floor [place], [place] [address] Office [phone]    Notice: This dictation was prepared with Dragon dictation along with smaller phrase technology. Any transcriptional errors that result from this process are unintentional and may not be corrected upon review.   I spent 14 minutes examining this patient, reviewing medications, and using patient centered shared decision making involving their cardiac care.   I spent  20 minutes reviewing past medical history,  medications, and prior cardiac tests.     [1] No Known Allergies  "

## 2024-10-08 ENCOUNTER — Encounter: Payer: Self-pay | Admitting: General Practice

## 2024-10-08 ENCOUNTER — Ambulatory Visit: Attending: General Practice | Admitting: General Practice

## 2024-10-08 VITALS — BP 118/82 | HR 83 | Ht 61.0 in | Wt 186.0 lb

## 2024-10-08 DIAGNOSIS — R072 Precordial pain: Secondary | ICD-10-CM

## 2024-10-08 DIAGNOSIS — R0789 Other chest pain: Secondary | ICD-10-CM | POA: Diagnosis not present

## 2024-10-08 DIAGNOSIS — I10 Essential (primary) hypertension: Secondary | ICD-10-CM

## 2024-10-08 DIAGNOSIS — E782 Mixed hyperlipidemia: Secondary | ICD-10-CM | POA: Diagnosis not present

## 2024-10-08 MED ORDER — METOPROLOL TARTRATE 100 MG PO TABS: 100.0000 mg | ORAL_TABLET | Freq: Once | ORAL | 0 refills | Status: AC

## 2024-10-08 NOTE — Patient Instructions (Signed)
 Medication Instructions:  Your physician recommends that you continue on your current medications as directed. Please refer to the Current Medication list given to you today.  *If you need a refill on your cardiac medications before your next appointment, please call your pharmacy*  Lab Work: None ordered  If you have labs (blood work) drawn today and your tests are completely normal, you will receive your results only by: MyChart Message (if you have MyChart) OR A paper copy in the mail If you have any lab test that is abnormal or we need to change your treatment, we will call you to review the results.  Testing/Procedures: Your physician has requested that you have an echocardiogram. Echocardiography is a painless test that uses sound waves to create images of your heart. It provides your doctor with information about the size and shape of your heart and how well your hearts chambers and valves are working. This procedure takes approximately one hour. There are no restrictions for this procedure. Please do NOT wear cologne, perfume, aftershave, or lotions (deodorant is allowed). Please arrive 15 minutes prior to your appointment time.  Please note: We ask at that you not bring children with you during ultrasound (echo/ vascular) testing. Due to room size and safety concerns, children are not allowed in the ultrasound rooms during exams. Our front office staff cannot provide observation of children in our lobby area while testing is being conducted. An adult accompanying a patient to their appointment will only be allowed in the ultrasound room at the discretion of the ultrasound technician under special circumstances. We apologize for any inconvenience.   Your physician has requested that you have cardiac CT. Cardiac computed tomography (CT) is a painless test that uses an x-ray machine to take clear, detailed pictures of your heart. For further information please visit [email or web address]  Please follow instruction sheet BELOW:    Your cardiac CT will be scheduled at one of the below locations:   [facility] [address] [place], [place] [address] [phone] (Severe contrast allergies only)  OR   [facility] [address] [place], [place] [address] [phone]  OR   [facility] [address] [place], [place] [address] [phone]  OR   [facility]. [facility] V[facility] Tower [address]  [place], [place] [address]  OR   [facility] [address] [place], [place] [phone]  If scheduled at [facility], please arrive at the [facility] and Children's Entrance (Entrance C2) of [facility] 30 minutes prior to test start time. You can use the FREE valet parking offered at entrance C (encouraged to control the heart rate for the test)  Proceed to the [facility] Radiology Department (first floor) to check-in and test prep.  All radiology patients and guests should use entrance C2 at [facility], accessed from [facility], even though the hospital's physical address listed is [address]  If scheduled at the Heart and Vascular Tower at [address], please enter the parking lot using the [address] entrance and use the FREE valet service at the patient drop-off area. Enter the building and check-in with registration on the main floor.  If scheduled at [facility], please arrive to the Heart and Vascular Center 15 mins early for check-in and test prep.  There is spacious parking and easy access to the radiology department from the [facility] Heart and Vascular entrance. Please  enter here and check-in with the desk attendant.   If scheduled at [facility], please arrive 30 minutes early for check-in and test prep.  Please follow these instructions carefully (unless otherwise directed):  An IV will be required  for this test and Nitroglycerin will be given.     On the Night Before the Test: Be sure to Drink plenty of water. Do not consume any caffeinated/decaffeinated beverages or chocolate 12 hours prior to your test. Do not take any antihistamines 12 hours prior to your test.    On the Day of the Test: Drink plenty of water until 1 hour prior to the test. Do not eat any food 1 hour prior to test. You may take your regular medications prior to the test.  Take metoprolol  (Lopressor ) 100 MG  two hours prior to test. THIS HAS BEEN SENT TO [facility]. HOLD OLMESARTAN -hydrochlorothiazide  ON THE MORNING OF THE CARDIAC CT FEMALES- please wear underwire-free bra if available, avoid dresses & tight clothing          After the Test: Drink plenty of water. After receiving IV contrast, you may experience a mild flushed feeling. This is normal. On occasion, you may experience a mild rash up to 24 hours after the test. This is not dangerous. If this occurs, you can take Benadryl 25 mg, Zyrtec, Claritin, or Allegra and increase your fluid intake. (Patients taking Tikosyn should avoid Benadryl, and may take Zyrtec, Claritin, or Allegra) If you experience trouble breathing, this can be serious. If it is severe call 911 IMMEDIATELY. If it is mild, please call our office.  We will call to schedule your test 2-4 weeks out understanding that some insurance companies will need an authorization prior to the service being performed.   For more information and frequently asked questions, please visit our website : [email or web address]  For non-scheduling related questions, please contact the cardiac imaging nurse navigator should you have any questions/concerns: Cardiac Imaging Nurse Navigators Direct Office Dial: [phone]   For scheduling needs, including cancellations and rescheduling, please call Brittany, [phone].     Follow-Up: At [facility], you and your health needs are our  priority.  As part of our continuing mission to provide you with exceptional heart care, our providers are all part of one team.  This team includes your primary Cardiologist (physician) and Advanced Practice Providers or APPs (Physician Assistants and Nurse Practitioners) who all work together to provide you with the care you need, when you need it.  Your next appointment:   2 weeks after Echocardiogram   Provider:   Josefa Beauvais, NP          We recommend signing up for the patient portal called MyChart.  Sign up information is provided on this After Visit Summary.  MyChart is used to connect with patients for Virtual Visits (Telemedicine).  Patients are able to view lab/test results, encounter notes, upcoming appointments, etc.  Non-urgent messages can be sent to your provider as well.   To learn more about what you can do with MyChart, go to [email or web address].   Other Instructions  Mindfulness-Based Stress Reduction: What to Know Mindfulness-based stress reduction (MBSR) is a mindfulness meditation program that normally takes place over 8 weeks. It usually includes weekly group classes and daily exercises to do at home. What are the benefits of MBSR? Mindfulness meditation therapies, like MBSR, can change a person's brain and body in good ways, and make them healthier. MBSR can have many benefits, such as:  Helping to lower stress hormones. Decreasing symptoms or helping to deal with symptoms of different conditions, like: Anxiety, which is feeling worried or nervous. Long-lasting pain. This is pain that lasts more than 3 months. Stress and worry. Trouble sleeping. Headaches, like migraines and tension headaches. Irritable bowel syndrome. Helping to handle stress from things you can't control, like: Long-term illnesses, especially if you have a lot of pain or other difficult symptoms. Big life events. Stress at work. Stress from taking care of someone else. Types of MBSR  exercises Mindfulness. This is a common type of meditation. Meditation. It helps you focus your mind to feel calm and happy. It has two main parts: paying attention and accepting. Paying attention means focusing on what is happening right now. This usually means noticing your breathing, your thoughts, how your body feels, and your emotions. Accepting means noticing these feelings and sensations without judging them. Instead of reacting to these thoughts or feelings, you just observe them and let them pass. MSBR exercises include: Body scanning. This is a mindfulness exercise where you pay attention to how different parts of your body feel. You can do this while lying down or sitting up. Sitting meditations. In this exercise, you focus on something like your breathing. When your mind starts to wander, gently bring it back to your breath. Keep doing this every time you notice your mind wandering. Mindful movements. This exercise involves moving and stretching your body slowly while paying attention to how it feels. Mindful Tasks. This means paying attention to how your body feels while doing things like walking or eating. Follow these instructions at home:  Find an in-person MBSR program or find a program that is online. Find a podcast or recording that provides guidance for MSBR exercises. Look for a therapist who knows how to use MBSR. Follow your treatment plan as told by your health care provider. This may include taking regular medicines and making changes to your diet or lifestyle. Where to find more information You can find more information about MBSR from: Your provider. Community-based meditation centers or programs. American Psychological Association at http://forbes-duran.com/. This information is not intended to replace advice given to you by your health care provider. Make sure you discuss any questions you have with your health care provider. Document  Revised: 12/07/2023 Document Reviewed: 12/07/2023 Elsevier Patient Education  2025 [employer].

## 2024-10-21 ENCOUNTER — Encounter (HOSPITAL_COMMUNITY): Payer: Self-pay

## 2024-10-23 ENCOUNTER — Ambulatory Visit (HOSPITAL_COMMUNITY)
Admission: RE | Admit: 2024-10-23 | Discharge: 2024-10-23 | Disposition: A | Discharge status: Home / Self Care | Source: Ambulatory Visit | Attending: Internal Medicine | Admitting: Internal Medicine

## 2024-10-23 DIAGNOSIS — R0789 Other chest pain: Secondary | ICD-10-CM | POA: Insufficient documentation

## 2024-10-23 DIAGNOSIS — R072 Precordial pain: Secondary | ICD-10-CM | POA: Diagnosis present

## 2024-10-23 MED ORDER — IOHEXOL 350 MG/ML SOLN
100.0000 mL | Freq: Once | INTRAVENOUS | Status: AC | PRN
  Administered 2024-10-23: 100 mL via INTRAVENOUS

## 2024-10-23 MED ORDER — NITROGLYCERIN 0.4 MG SL SUBL
0.8000 mg | SUBLINGUAL_TABLET | Freq: Once | SUBLINGUAL | Status: AC
  Administered 2024-10-23: 0.8 mg via SUBLINGUAL

## 2024-10-23 MED ORDER — SODIUM CHLORIDE 0.9 % IV SOLN: Freq: Once | INTRAVENOUS | Status: AC

## 2024-10-24 ENCOUNTER — Ambulatory Visit: Payer: Self-pay | Admitting: General Practice

## 2024-11-01 ENCOUNTER — Other Ambulatory Visit (HOSPITAL_BASED_OUTPATIENT_CLINIC_OR_DEPARTMENT_OTHER): Payer: Self-pay

## 2024-11-15 ENCOUNTER — Ambulatory Visit (HOSPITAL_COMMUNITY)
Admission: RE | Admit: 2024-11-15 | Discharge: 2024-11-15 | Disposition: A | Source: Ambulatory Visit | Attending: General Practice

## 2024-11-15 DIAGNOSIS — R0789 Other chest pain: Secondary | ICD-10-CM | POA: Insufficient documentation

## 2024-11-15 LAB — ECHOCARDIOGRAM COMPLETE
AR max vel: 2.13 cm2
AV Area VTI: 1.91 cm2
AV Area mean vel: 1.98 cm2
AV Mean grad: 4 mmHg
AV Peak grad: 8.2 mmHg
Ao pk vel: 1.43 m/s
Area-P 1/2: 4.6 cm2
Calc EF: 62.3 %
S' Lateral: 2.4 cm
Single Plane A2C EF: 63.8 %
Single Plane A4C EF: 61.8 %

## 2024-11-15 NOTE — Progress Notes (Signed)
*  PRELIMINARY RESULTS* Echocardiogram 2D Echocardiogram has been performed.  Jeanette Stuart 11/15/2024, 2:57 PM

## 2024-12-03 ENCOUNTER — Ambulatory Visit: Admitting: General Practice
